# Patient Record
Sex: Female | Born: 1940 | Race: Black or African American | Hispanic: No | Smoking: Current some day smoker
Health system: Southern US, Community
[De-identification: ages and names within clinical notes are randomized; demographics above are authoritative.]

## PROBLEM LIST (undated history)

## (undated) DIAGNOSIS — E119 Type 2 diabetes mellitus without complications: Secondary | ICD-10-CM

## (undated) DIAGNOSIS — E7800 Pure hypercholesterolemia, unspecified: Secondary | ICD-10-CM

## (undated) DIAGNOSIS — R42 Dizziness and giddiness: Secondary | ICD-10-CM

## (undated) DIAGNOSIS — I10 Essential (primary) hypertension: Secondary | ICD-10-CM

## (undated) HISTORY — PX: ABDOMINAL HYSTERECTOMY: SHX81

---

## 2017-01-09 ENCOUNTER — Emergency Department (HOSPITAL_BASED_OUTPATIENT_CLINIC_OR_DEPARTMENT_OTHER)
Admission: EM | Admit: 2017-01-09 | Discharge: 2017-01-09 | Disposition: A | Discharge status: Home / Self Care | Payer: Medicare HMO | Attending: Physician Assistant | Admitting: Physician Assistant

## 2017-01-09 ENCOUNTER — Encounter (HOSPITAL_BASED_OUTPATIENT_CLINIC_OR_DEPARTMENT_OTHER): Payer: Self-pay | Admitting: *Deleted

## 2017-01-09 ENCOUNTER — Emergency Department (HOSPITAL_BASED_OUTPATIENT_CLINIC_OR_DEPARTMENT_OTHER): Payer: Medicare HMO

## 2017-01-09 DIAGNOSIS — F17200 Nicotine dependence, unspecified, uncomplicated: Secondary | ICD-10-CM | POA: Insufficient documentation

## 2017-01-09 DIAGNOSIS — I10 Essential (primary) hypertension: Secondary | ICD-10-CM | POA: Diagnosis not present

## 2017-01-09 DIAGNOSIS — H81399 Other peripheral vertigo, unspecified ear: Secondary | ICD-10-CM | POA: Insufficient documentation

## 2017-01-09 DIAGNOSIS — R42 Dizziness and giddiness: Secondary | ICD-10-CM | POA: Diagnosis present

## 2017-01-09 DIAGNOSIS — Z79899 Other long term (current) drug therapy: Secondary | ICD-10-CM | POA: Diagnosis not present

## 2017-01-09 DIAGNOSIS — E119 Type 2 diabetes mellitus without complications: Secondary | ICD-10-CM | POA: Diagnosis not present

## 2017-01-09 HISTORY — DX: Essential (primary) hypertension: I10

## 2017-01-09 HISTORY — DX: Dizziness and giddiness: R42

## 2017-01-09 HISTORY — DX: Pure hypercholesterolemia, unspecified: E78.00

## 2017-01-09 HISTORY — DX: Type 2 diabetes mellitus without complications: E11.9

## 2017-01-09 LAB — COMPREHENSIVE METABOLIC PANEL
ALK PHOS: 63 U/L (ref 38–126)
ALT: 18 U/L (ref 14–54)
ANION GAP: 8 (ref 5–15)
AST: 24 U/L (ref 15–41)
Albumin: 4.2 g/dL (ref 3.5–5.0)
BILIRUBIN TOTAL: 0.2 mg/dL — AB (ref 0.3–1.2)
BUN: 18 mg/dL (ref 6–20)
CO2: 30 mmol/L (ref 22–32)
Calcium: 9.3 mg/dL (ref 8.9–10.3)
Chloride: 102 mmol/L (ref 101–111)
Creatinine, Ser: 0.88 mg/dL (ref 0.44–1.00)
GFR calc non Af Amer: >60 mL/min (ref >60)
Glucose, Bld: 85 mg/dL (ref 65–99)
Potassium: 4.1 mmol/L (ref 3.5–5.1)
SODIUM: 140 mmol/L (ref 135–145)
TOTAL PROTEIN: 7 g/dL (ref 6.5–8.1)

## 2017-01-09 LAB — TROPONIN I: Troponin I: <0.03 ng/mL (ref <0.03)

## 2017-01-09 LAB — RAPID URINE DRUG SCREEN, HOSP PERFORMED
Amphetamines: NOT DETECTED
BENZODIAZEPINES: NOT DETECTED
Barbiturates: NOT DETECTED
COCAINE: NOT DETECTED
Opiates: NOT DETECTED
Tetrahydrocannabinol: NOT DETECTED

## 2017-01-09 LAB — DIFFERENTIAL
Basophils Absolute: 0 10*3/uL (ref 0.0–0.1)
Basophils Relative: 0 %
EOS PCT: 2 %
Eosinophils Absolute: 0.1 10*3/uL (ref 0.0–0.7)
LYMPHS ABS: 2.6 10*3/uL (ref 0.7–4.0)
LYMPHS PCT: 50 %
MONO ABS: 0.3 10*3/uL (ref 0.1–1.0)
Monocytes Relative: 6 %
Neutro Abs: 2.2 10*3/uL (ref 1.7–7.7)
Neutrophils Relative %: 42 %

## 2017-01-09 LAB — URINALYSIS, ROUTINE W REFLEX MICROSCOPIC
Bilirubin Urine: NEGATIVE
GLUCOSE, UA: NEGATIVE mg/dL
HGB URINE DIPSTICK: NEGATIVE
Ketones, ur: NEGATIVE mg/dL
Leukocytes, UA: NEGATIVE
Nitrite: NEGATIVE
Protein, ur: NEGATIVE mg/dL
SPECIFIC GRAVITY, URINE: 1.011 (ref 1.005–1.030)
pH: 6.5 (ref 5.0–8.0)

## 2017-01-09 LAB — CBC
HCT: 40.1 % (ref 36.0–46.0)
Hemoglobin: 13.7 g/dL (ref 12.0–15.0)
MCH: 30.9 pg (ref 26.0–34.0)
MCHC: 34.2 g/dL (ref 30.0–36.0)
MCV: 90.5 fL (ref 78.0–100.0)
PLATELETS: 255 10*3/uL (ref 150–400)
RBC: 4.43 MIL/uL (ref 3.87–5.11)
RDW: 13.7 % (ref 11.5–15.5)
WBC: 5.2 10*3/uL (ref 4.0–10.5)

## 2017-01-09 LAB — PROTIME-INR
INR: 0.92
PROTHROMBIN TIME: 12.4 s (ref 11.4–15.2)

## 2017-01-09 LAB — ETHANOL

## 2017-01-09 LAB — APTT: aPTT: 29 seconds (ref 24–36)

## 2017-01-09 MED ORDER — ONDANSETRON HCL 4 MG PO TABS: 4.0000 mg | ORAL_TABLET | Freq: Three times a day (TID) | ORAL | 0 refills | Status: DC | PRN

## 2017-01-09 MED ORDER — SODIUM CHLORIDE 0.9 % IV BOLUS (SEPSIS)
1000.0000 mL | Freq: Once | INTRAVENOUS | Status: AC
  Administered 2017-01-09: 1000 mL via INTRAVENOUS

## 2017-01-09 MED ORDER — MECLIZINE HCL 25 MG PO TABS: 25.0000 mg | ORAL_TABLET | Freq: Three times a day (TID) | ORAL | 0 refills | Status: DC | PRN

## 2017-01-09 MED ORDER — MECLIZINE HCL 25 MG PO TABS
50.0000 mg | ORAL_TABLET | Freq: Once | ORAL | Status: AC
  Administered 2017-01-09: 50 mg via ORAL
  Filled 2017-01-09: qty 2

## 2017-01-09 NOTE — ED Triage Notes (Signed)
Dizziness. Hx of vertigo 2 weeks ago.

## 2017-01-09 NOTE — ED Provider Notes (Signed)
[facility]   CSN: [id number] Arrival date & time: 01/09/17  1528  By signing my name below, I, Alison Garcia, attest that this documentation has been prepared under the direction and in the presence of Arthor CaptainAbigail Arelly Whittenberg, PA-C.  Electronically Signed: Rosana Fretana Garcia, ED Scribe. 01/09/17. 4:48 PM.  History   Chief Complaint Chief Complaint  Patient presents with  . Dizziness   The history is provided by the patient. No language interpreter was used.   HPI Comments: Alison Garcia is a 76 y.o. female with a PMHx of DM and HLD, who presents to the Emergency Department complaining of sudden onset, moderate dizziness onset yesterday. Pt describes the dizziness as a room-spinning sensation. Per pt, she had an episode of vertigo with associated vomiting 7 months ago and 1 month ago. Since the most recent episode, pt notes she has a dull headache in her forehead and dizziness when she moves her head downward. Pt uses tobacco. No hx of HTN. Pt tried meclizine after her episode 1 month ago with no relief.   Past Medical History:  Diagnosis Date  . Diabetes mellitus without complication (HCC)   . High cholesterol   . Hypertension   . Vertigo     There are no active problems to display for this patient.   History reviewed. No pertinent surgical history.  OB History    No data available       Home Medications    Prior to Admission medications   Medication Sig Start Date End Date Taking? Authorizing Provider  LISINOPRIL PO Take by mouth.   Yes [provider]  LOVASTATIN PO Take by mouth.   Yes [provider]  METFORMIN HCL PO Take by mouth.   Yes [provider]    Family History No family history on file.  Social History Social History  Substance Use Topics  . Smoking status: Current Some Day Smoker  . Smokeless tobacco: Never Used  . Alcohol use No     Allergies   Codeine   Review of Systems Review of  Systems All other systems reviewed and are negative for acute change except as noted in the HPI.  Physical Exam Updated Vital Signs BP (!) 111/57 (BP Location: Left Arm)   Pulse 72   Temp 98 F (36.7 C) (Oral)   Resp 16   Ht 5\' 6"  (1.676 m)   Wt 120 lb (54.4 kg)   SpO2 96% Comment: unable to obtain. finger cold  BMI 19.37 kg/m   Physical Exam  Constitutional: She is oriented to person, place, and time. She appears well-developed and well-nourished. No distress.  HENT:  Head: Normocephalic and atraumatic.  Mouth/Throat: Oropharynx is clear and moist.  Eyes: Pupils are equal, round, and reactive to light. Conjunctivae and EOM are normal. Right eye exhibits no discharge. Left eye exhibits no discharge.  Neck: Normal range of motion.  Cardiovascular: Normal rate, regular rhythm and normal heart sounds.  Exam reveals no gallop and no friction rub.   No murmur heard. Pulmonary/Chest: Effort normal and breath sounds normal. No respiratory distress. She has no wheezes. She has no rales.  Abdominal: Soft. She exhibits no distension. There is no tenderness.  Musculoskeletal: Normal range of motion.  Neurological: She is alert and oriented to person, place, and time. She displays normal reflexes. No cranial nerve deficit or sensory deficit. She exhibits normal muscle tone. Coordination normal.  Speech is clear and goal oriented, follows commands Major Cranial nerves without  deficit, no facial droop Normal strength in upper and lower extremities bilaterally including dorsiflexion and plantar flexion, strong and equal grip strength Sensation normal to light and sharp touch Moves extremities without ataxia, coordination intact Normal finger to nose and rapid alternating movements Neg romberg, no pronator drift Normal gait Normal heel-shin and balance   Skin: Skin is warm and dry.  Psychiatric: She has a normal mood and affect. Judgment normal.  Nursing note and vitals reviewed.    ED  Treatments / Results  DIAGNOSTIC STUDIES: Oxygen Saturation is 100% on RA, normal by my interpretation.   COORDINATION OF CARE: 4:46 PM-Discussed next steps with pt including a CT. Pt verbalized understanding and is agreeable with the plan.   Labs (all labs ordered are listed, but only abnormal results are displayed) Labs Reviewed  COMPREHENSIVE METABOLIC PANEL - Abnormal; Notable for the following:       Result Value   Total Bilirubin 0.2 (*)    All other components within normal limits  ETHANOL  PROTIME-INR  APTT  CBC  DIFFERENTIAL  RAPID URINE DRUG SCREEN, HOSP PERFORMED  URINALYSIS, ROUTINE W REFLEX MICROSCOPIC  TROPONIN I    EKG  EKG Interpretation None       Radiology Ct Head Wo Contrast  Result Date: 01/09/2017 CLINICAL DATA:  Vertigo today.  Headache. EXAM: CT HEAD WITHOUT CONTRAST TECHNIQUE: Contiguous axial images were obtained from the base of the skull through the vertex without intravenous contrast. COMPARISON:  None. FINDINGS: Brain: There is mild cortical atrophy. No evidence of acute abnormality including hemorrhage, infarct, mass lesion, mass effect, midline shift or abnormal extra-axial fluid collection. No hydrocephalus or pneumocephalus. Vascular: Atherosclerosis noted. Skull: Intact. Sinuses/Orbits: Negative. Other: None. IMPRESSION: No acute abnormality. Mild atrophy. Atherosclerosis. Electronically Signed   By: Drusilla Kanner M.D.   On: 01/09/2017 17:37    Procedures Procedures (including critical care time)  Medications Ordered in ED Medications  meclizine (ANTIVERT) tablet 50 mg (50 mg Oral Given 01/09/17 1657)  sodium chloride 0.9 % bolus 1,000 mL (1,000 mLs Intravenous New Bag/Given 01/09/17 1710)     Initial Impression / Assessment and Plan / ED Course  I have reviewed the triage vital signs and the nursing notes.  Pertinent labs & imaging results that were available during my care of the patient were reviewed by me and considered in my  medical decision making (see chart for details).     . The patient with positional vertigo symptoms. She is asymptomatic after meclizine. 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 The patient is without nausea or vomiting at this time. Negative CT head. Although she has risk factors for central cause. I doubt stroke and I have seen the patient in shared visit with Dr. Corlis Leak who is in strong agreement. The patient will be discharged with meclizine and Zofran. I discussed return precautions. She is to follow up with PCP. Also given  Referral to ear, nose and throat.  Final Clinical Impressions(s) / ED Diagnoses   Final diagnoses:  Peripheral vertigo, unspecified laterality    New Prescriptions New Prescriptions   No medications on file  I personally performed the services described in this documentation, which was scribed in my presence. The recorded information has been reviewed and is accurate.       Arthor Captain, PA-C 01/10/17 0051    Abelino Derrick, MD 01/10/17 1559    Abelino Derrick, MD 01/10/17 1600

## 2017-01-09 NOTE — Discharge Instructions (Signed)
Get help right away if: You have difficulty speaking or moving. You are always dizzy. You faint. You develop severe headaches. You have weakness in your legs or arms. You have changes in your hearing or vision. You develop a stiff neck. You develop sensitivity to light.

## 2017-01-09 NOTE — ED Notes (Signed)
Pt on monitor 

## 2019-08-28 ENCOUNTER — Emergency Department (HOSPITAL_BASED_OUTPATIENT_CLINIC_OR_DEPARTMENT_OTHER)
Admission: EM | Admit: 2019-08-28 | Discharge: 2019-08-28 | Disposition: A | Discharge status: Home / Self Care | Payer: Medicare HMO | Attending: Emergency Medicine | Admitting: Emergency Medicine

## 2019-08-28 ENCOUNTER — Encounter (HOSPITAL_BASED_OUTPATIENT_CLINIC_OR_DEPARTMENT_OTHER): Payer: Self-pay

## 2019-08-28 ENCOUNTER — Other Ambulatory Visit: Payer: Self-pay

## 2019-08-28 ENCOUNTER — Emergency Department (HOSPITAL_BASED_OUTPATIENT_CLINIC_OR_DEPARTMENT_OTHER): Payer: Medicare HMO

## 2019-08-28 DIAGNOSIS — J189 Pneumonia, unspecified organism: Secondary | ICD-10-CM | POA: Diagnosis not present

## 2019-08-28 DIAGNOSIS — Z885 Allergy status to narcotic agent status: Secondary | ICD-10-CM | POA: Insufficient documentation

## 2019-08-28 DIAGNOSIS — E119 Type 2 diabetes mellitus without complications: Secondary | ICD-10-CM | POA: Diagnosis not present

## 2019-08-28 DIAGNOSIS — R0789 Other chest pain: Secondary | ICD-10-CM | POA: Insufficient documentation

## 2019-08-28 DIAGNOSIS — R059 Cough, unspecified: Secondary | ICD-10-CM

## 2019-08-28 DIAGNOSIS — E782 Mixed hyperlipidemia: Secondary | ICD-10-CM | POA: Diagnosis not present

## 2019-08-28 DIAGNOSIS — Z7984 Long term (current) use of oral hypoglycemic drugs: Secondary | ICD-10-CM | POA: Diagnosis not present

## 2019-08-28 DIAGNOSIS — F17210 Nicotine dependence, cigarettes, uncomplicated: Secondary | ICD-10-CM | POA: Insufficient documentation

## 2019-08-28 DIAGNOSIS — I10 Essential (primary) hypertension: Secondary | ICD-10-CM | POA: Insufficient documentation

## 2019-08-28 DIAGNOSIS — J029 Acute pharyngitis, unspecified: Secondary | ICD-10-CM | POA: Diagnosis present

## 2019-08-28 DIAGNOSIS — Z79899 Other long term (current) drug therapy: Secondary | ICD-10-CM | POA: Diagnosis not present

## 2019-08-28 DIAGNOSIS — R05 Cough: Secondary | ICD-10-CM

## 2019-08-28 LAB — BASIC METABOLIC PANEL
Anion gap: 7 (ref 5–15)
BUN: 14 mg/dL (ref 8–23)
CO2: 28 mmol/L (ref 22–32)
Calcium: 9.3 mg/dL (ref 8.9–10.3)
Chloride: 102 mmol/L (ref 98–111)
Creatinine, Ser: 0.76 mg/dL (ref 0.44–1.00)
GFR calc Af Amer: >60 mL/min (ref >60)
GFR calc non Af Amer: >60 mL/min (ref >60)
Glucose, Bld: 100 mg/dL — ABNORMAL HIGH (ref 70–99)
Potassium: 4.5 mmol/L (ref 3.5–5.1)
Sodium: 137 mmol/L (ref 135–145)

## 2019-08-28 LAB — CBC
HCT: 42.8 % (ref 36.0–46.0)
Hemoglobin: 13.8 g/dL (ref 12.0–15.0)
MCH: 30.5 pg (ref 26.0–34.0)
MCHC: 32.2 g/dL (ref 30.0–36.0)
MCV: 94.7 fL (ref 80.0–100.0)
Platelets: 231 10*3/uL (ref 150–400)
RBC: 4.52 MIL/uL (ref 3.87–5.11)
RDW: 13.5 % (ref 11.5–15.5)
WBC: 4.6 10*3/uL (ref 4.0–10.5)
nRBC: 0 % (ref 0.0–0.2)

## 2019-08-28 LAB — TROPONIN I (HIGH SENSITIVITY): Troponin I (High Sensitivity): 3 ng/L (ref <18)

## 2019-08-28 MED ORDER — AEROCHAMBER PLUS FLO-VU MISC: 2 refills | Status: AC

## 2019-08-28 MED ORDER — ALBUTEROL SULFATE HFA 108 (90 BASE) MCG/ACT IN AERS: 2.0000 | INHALATION_SPRAY | RESPIRATORY_TRACT | 0 refills | Status: DC | PRN

## 2019-08-28 MED ORDER — PANTOPRAZOLE SODIUM 20 MG PO TBEC: 20.0000 mg | DELAYED_RELEASE_TABLET | Freq: Every day | ORAL | 0 refills | Status: DC

## 2019-08-28 MED ORDER — AZITHROMYCIN 250 MG PO TABS: ORAL_TABLET | ORAL | 0 refills | Status: DC

## 2019-08-28 MED ORDER — HYDROCODONE-HOMATROPINE 5-1.5 MG/5ML PO SYRP: 5.0000 mL | ORAL_SOLUTION | Freq: Four times a day (QID) | ORAL | 0 refills | Status: DC | PRN

## 2019-08-28 NOTE — ED Provider Notes (Addendum)
[facility] EMERGENCY DEPARTMENT Provider Note   CSN: [id number] Arrival date & time: 08/28/19  1139     History Chief Complaint  Patient presents with  . Sore Throat  . Chest Pain    Alison Garcia is a 79 y.o. female.  HPI Patient reports that she has had some sore throat started about 2 days ago.  She reports she noticed it most on the left side as she was swallowing or taking deep breath.  Patient is also developed cough over the past 2 days.  She reports it started to become very productive of mucus.  She reports she feels like something is kind of congested and the center of her chest and also notes that when she is eating it feels like things get stuck a little bit.  No vomiting.  No abdominal pain.  No diarrhea.  She reports she has been experiencing some chest discomfort with coughing.  With coughing episodes she reports feeling slightly short of breath.  Patient reports that she got her second Covid shot 2 days ago.  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  Reports her diabetes is now diet controlled.  She does not require any medications.    Past Medical History:  Diagnosis Date  . Diabetes mellitus without complication (HCC)   . High cholesterol   . Hypertension   . Vertigo     There are no problems to display for this patient.   History reviewed. No pertinent surgical history.   OB History   No obstetric history on file.     No family history on file.  Social History   Tobacco Use  . Smoking status: Current Some Day Smoker  . Smokeless tobacco: Never Used  Substance Use Topics  . Alcohol use: No  . Drug use: No    Home Medications Prior to Admission medications   Medication Sig Start Date End Date Taking? Authorizing Provider  albuterol (VENTOLIN HFA) 108 (90 Base) MCG/ACT inhaler Inhale 2 puffs into the lungs every 4 (four) hours as  needed for wheezing or shortness of breath. 08/28/19   Arby Barrette, MD  azithromycin (ZITHROMAX Z-PAK) 250 MG tablet 2 po day one, then 1 daily x 4 days 08/28/19   Arby Barrette, MD  HYDROcodone-homatropine [facility]) 5-1.5 MG/5ML syrup Take 5 mLs by mouth every 6 (six) hours as needed for cough. 08/28/19   Arby Barrette, MD  LISINOPRIL PO Take by mouth.    [provider]  LOVASTATIN PO Take by mouth.    [provider]  meclizine (ANTIVERT) 25 MG tablet Take 1-2 tablets (25-50 mg total) by mouth 3 (three) times daily as needed for dizziness or nausea. 01/09/17   Harris, Abigail, PA-C  METFORMIN HCL PO Take by mouth.    [provider]  ondansetron (ZOFRAN) 4 MG tablet Take 1 tablet (4 mg total) by mouth every 8 (eight) hours as needed for nausea or vomiting. 01/09/17   Arthor Captain, PA-C  pantoprazole (PROTONIX) 20 MG tablet Take 1 tablet (20 mg total) by mouth daily. 08/28/19   Arby Barrette, MD  Spacer/Aero-Holding Chambers (AEROCHAMBER PLUS WITH MASK) inhaler Use as instructed 08/28/19   Arby Barrette, MD    Allergies    Codeine  Review of Systems   Review of Systems 10 Systems reviewed and are negative for acute change except as noted in the  HPI. Physical Exam Updated Vital Signs BP 107/61 (BP Location: Right Arm)   Pulse 81   Temp 98.9 F (37.2 C) (Oral)   Resp 18   Ht 5\' 6"  (1.676 m)   Wt 52.6 kg   SpO2 97%   BMI 18.72 kg/m   Physical Exam Constitutional:      Comments: Patient is alert and clinically well in appearance.  Well-nourished well-developed.  No respiratory distress.  HENT:     Head: Normocephalic and atraumatic.     Nose: Nose normal.     Mouth/Throat:     Mouth: Mucous membranes are moist.     Pharynx: Oropharynx is clear.     Comments: No erythema or exudate of the posterior oropharynx. Eyes:     Extraocular Movements: Extraocular movements intact.     Conjunctiva/sclera: Conjunctivae normal.  Neck:     Comments:  Neck is supple.  Patient has mild discomfort to the cervical tonsil area on the left.  There however is no significant lymphadenopathy.  No soft tissue swelling. Cardiovascular:     Rate and Rhythm: Normal rate and regular rhythm.     Pulses: Normal pulses.     Heart sounds: Normal heart sounds.  Pulmonary:     Comments: No respiratory distress.  Lungs are clear on the left.  Right lung has occasional expiratory wheeze from the mid lung field to the base. Abdominal:     General: There is no distension.     Palpations: Abdomen is soft.     Tenderness: There is no abdominal tenderness. There is no guarding.  Musculoskeletal:        General: No swelling or tenderness. Normal range of motion.     Right lower leg: No edema.     Left lower leg: No edema.  Skin:    General: Skin is warm and dry.  Neurological:     General: No focal deficit present.     Mental Status: She is oriented to person, place, and time.     Coordination: Coordination normal.  Psychiatric:        Mood and Affect: Mood normal.     ED Results / Procedures / Treatments   Labs (all labs ordered are listed, but only abnormal results are displayed) Labs Reviewed  BASIC METABOLIC PANEL - Abnormal; Notable for the following components:      Result Value   Glucose, Bld 100 (*)    All other components within normal limits  CBC  TROPONIN I (HIGH SENSITIVITY)  TROPONIN I (HIGH SENSITIVITY)    EKG EKG Interpretation  Date/Time:  Sunday August 28 2019 12:36:24 EST Ventricular Rate:  78 PR Interval:  156 QRS Duration: 64 QT Interval:  352 QTC Calculation: 401 R Axis:   83 Text Interpretation: Normal sinus rhythm Normal ECG no ischemic changes, no change from previous Confirmed by [id number] [phone]) on 08/28/2019 12:43:29 PM   Radiology DG Chest Port 1 View  Result Date: 08/28/2019 CLINICAL DATA:  Is sore throat, left-sided chest pain with mild shortness of breath and cough for 3 days. EXAM: PORTABLE CHEST  1 VIEW COMPARISON:  03/16/2019 FINDINGS: Interval development of predominantly linear opacity in the right lung base compared to the prior study. Cardiomediastinal contours are stable with signs of aortic atherosclerosis. Lungs are otherwise clear. No signs of pleural effusion. Visualized skeletal structures are unremarkable. IMPRESSION: Interval development of predominantly linear opacity in the right lung base which may represent atelectasis or early infiltrate. Electronically Signed  By: Zetta Bills M.D.   On: 08/28/2019 13:08    Procedures Procedures (including critical care time)  Medications Ordered in ED Medications - No data to display  ED Course  I have reviewed the triage vital signs and the nursing notes.  Pertinent labs & imaging results that were available during my care of the patient were reviewed by me and considered in my medical decision making (see chart for details).    MDM Rules/Calculators/A&P                     Patient is clinically well in appearance.  She has developed sore throat and productive cough over the past 2 days.  No documented fever.  Patient denies she is having generalized body aches or chills.  She did just get her second Covid shot 2 days ago.  At this time, I have lower suspicion for Covid.  Patient does have positive infiltrate identified on chest x-ray.  Along with this she does have audible wheeze local to this area combined with productive cough.  Patient however is nontoxic.  Her mental status is excellent.  She is in very good physical condition with well-controlled chronic medical problems.  No lower extremity swelling or calf tenderness.  At this time will initiate treatment for community-acquired pneumonia with azithromycin.  Patient counseled to use inhaler for wheezing and Hycodan if needed for nighttime cough.  Patient also described sensation of congestion in the center of her chest and food seeming to stick a little bit.  Will try an  empiric course of Protonix for 2 weeks.  She is to discuss this with her PCP.  No signs of impaction or food bolus.  Troponin normal and EKG without ischemic appearance.  Very low suspicion for cardiac ischemic etiology.  Return precautions are reviewed.  Patient should have recheck with PCP for symptom recheck in the next week. Final Clinical Impression(s) / ED Diagnoses Final diagnoses:  Community acquired pneumonia of right lower lobe of lung    Rx / DC Orders ED Discharge Orders         Ordered    azithromycin (ZITHROMAX Z-PAK) 250 MG tablet     08/28/19 1510    Spacer/Aero-Holding Chambers (AEROCHAMBER PLUS WITH MASK) inhaler     08/28/19 1510    albuterol (VENTOLIN HFA) 108 (90 Base) MCG/ACT inhaler  Every 4 hours PRN     08/28/19 1510    HYDROcodone-homatropine (HYCODAN) 5-1.5 MG/5ML syrup  Every 6 hours PRN     08/28/19 1510    pantoprazole (PROTONIX) 20 MG tablet  Daily     08/28/19 1515           Charlesetta Shanks, MD 08/28/19 1517    Charlesetta Shanks, MD 08/28/19 1517

## 2019-08-28 NOTE — Discharge Instructions (Addendum)
1.  Start taking azithromycin as prescribed.  You take 2 tablets on the first day and then 1 tablet daily for 4 days. 2.  Use the inhaler as prescribed with the spacer.  This is to treat wheezing that is heard in your right lung.  This should also help with coughing. 3.  You may try the Hycodan syrup for nighttime cough.  This may make you drowsy.  Do not take this when you will be out and driving. 4.  Make an appointment to see your family doctor for recheck within the next week. 5.  Return to the emergency department if you develop a fever, worsening chest pain, shortness of breath or other concerning symptoms.

## 2019-08-28 NOTE — ED Triage Notes (Signed)
Pt arrives ambulatory to ED with reports of sore throat, states it radiates into her chest, pt points to left chest. Also endorses chills last night with cough and SOB. States that she his history of bronchitis.

## 2020-10-23 ENCOUNTER — Other Ambulatory Visit: Payer: Self-pay

## 2020-10-23 ENCOUNTER — Emergency Department (HOSPITAL_BASED_OUTPATIENT_CLINIC_OR_DEPARTMENT_OTHER): Payer: Medicare HMO

## 2020-10-23 ENCOUNTER — Emergency Department (HOSPITAL_BASED_OUTPATIENT_CLINIC_OR_DEPARTMENT_OTHER)
Admission: EM | Admit: 2020-10-23 | Discharge: 2020-10-23 | Disposition: A | Discharge status: Home / Self Care | Payer: Medicare HMO | Attending: Emergency Medicine | Admitting: Emergency Medicine

## 2020-10-23 ENCOUNTER — Encounter (HOSPITAL_BASED_OUTPATIENT_CLINIC_OR_DEPARTMENT_OTHER): Payer: Self-pay

## 2020-10-23 DIAGNOSIS — E119 Type 2 diabetes mellitus without complications: Secondary | ICD-10-CM | POA: Diagnosis not present

## 2020-10-23 DIAGNOSIS — S060X0A Concussion without loss of consciousness, initial encounter: Secondary | ICD-10-CM | POA: Diagnosis not present

## 2020-10-23 DIAGNOSIS — R1110 Vomiting, unspecified: Secondary | ICD-10-CM

## 2020-10-23 DIAGNOSIS — Z79899 Other long term (current) drug therapy: Secondary | ICD-10-CM | POA: Insufficient documentation

## 2020-10-23 DIAGNOSIS — F17200 Nicotine dependence, unspecified, uncomplicated: Secondary | ICD-10-CM | POA: Insufficient documentation

## 2020-10-23 DIAGNOSIS — W19XXXA Unspecified fall, initial encounter: Secondary | ICD-10-CM

## 2020-10-23 DIAGNOSIS — R197 Diarrhea, unspecified: Secondary | ICD-10-CM | POA: Insufficient documentation

## 2020-10-23 DIAGNOSIS — Z7984 Long term (current) use of oral hypoglycemic drugs: Secondary | ICD-10-CM | POA: Insufficient documentation

## 2020-10-23 DIAGNOSIS — W01198A Fall on same level from slipping, tripping and stumbling with subsequent striking against other object, initial encounter: Secondary | ICD-10-CM | POA: Insufficient documentation

## 2020-10-23 DIAGNOSIS — I10 Essential (primary) hypertension: Secondary | ICD-10-CM | POA: Insufficient documentation

## 2020-10-23 DIAGNOSIS — S0990XA Unspecified injury of head, initial encounter: Secondary | ICD-10-CM | POA: Diagnosis present

## 2020-10-23 LAB — CBC WITH DIFFERENTIAL/PLATELET
Abs Immature Granulocytes: 0.01 10*3/uL (ref 0.00–0.07)
Basophils Absolute: 0 10*3/uL (ref 0.0–0.1)
Basophils Relative: 0 %
Eosinophils Absolute: 0 10*3/uL (ref 0.0–0.5)
Eosinophils Relative: 1 %
HCT: 40.6 % (ref 36.0–46.0)
Hemoglobin: 13.3 g/dL (ref 12.0–15.0)
Immature Granulocytes: 0 %
Lymphocytes Relative: 44 %
Lymphs Abs: 1.8 10*3/uL (ref 0.7–4.0)
MCH: 31.1 pg (ref 26.0–34.0)
MCHC: 32.8 g/dL (ref 30.0–36.0)
MCV: 94.9 fL (ref 80.0–100.0)
Monocytes Absolute: 0.3 10*3/uL (ref 0.1–1.0)
Monocytes Relative: 7 %
Neutro Abs: 1.9 10*3/uL (ref 1.7–7.7)
Neutrophils Relative %: 48 %
Platelets: 218 10*3/uL (ref 150–400)
RBC: 4.28 MIL/uL (ref 3.87–5.11)
RDW: 13.2 % (ref 11.5–15.5)
WBC: 4.1 10*3/uL (ref 4.0–10.5)
nRBC: 0 % (ref 0.0–0.2)

## 2020-10-23 LAB — COMPREHENSIVE METABOLIC PANEL
ALT: 28 U/L (ref 0–44)
AST: 38 U/L (ref 15–41)
Albumin: 3.7 g/dL (ref 3.5–5.0)
Alkaline Phosphatase: 58 U/L (ref 38–126)
Anion gap: 9 (ref 5–15)
BUN: 13 mg/dL (ref 8–23)
CO2: 24 mmol/L (ref 22–32)
Calcium: 9 mg/dL (ref 8.9–10.3)
Chloride: 104 mmol/L (ref 98–111)
Creatinine, Ser: 0.75 mg/dL (ref 0.44–1.00)
GFR, Estimated: >60 mL/min (ref >60)
Glucose, Bld: 96 mg/dL (ref 70–99)
Potassium: 4.2 mmol/L (ref 3.5–5.1)
Sodium: 137 mmol/L (ref 135–145)
Total Bilirubin: 0.3 mg/dL (ref 0.3–1.2)
Total Protein: 6.5 g/dL (ref 6.5–8.1)

## 2020-10-23 LAB — URINALYSIS, ROUTINE W REFLEX MICROSCOPIC
Bilirubin Urine: NEGATIVE
Glucose, UA: NEGATIVE mg/dL
Hgb urine dipstick: NEGATIVE
Ketones, ur: NEGATIVE mg/dL
Leukocytes,Ua: NEGATIVE
Nitrite: NEGATIVE
Protein, ur: NEGATIVE mg/dL
Specific Gravity, Urine: <1.005 — ABNORMAL LOW (ref 1.005–1.030)
pH: 5.5 (ref 5.0–8.0)

## 2020-10-23 LAB — CBG MONITORING, ED: Glucose-Capillary: 90 mg/dL (ref 70–99)

## 2020-10-23 MED ORDER — SODIUM CHLORIDE 0.9 % IV BOLUS
500.0000 mL | Freq: Once | INTRAVENOUS | Status: AC
Start: 2020-10-23 — End: 2020-10-23
  Administered 2020-10-23: 500 mL via INTRAVENOUS

## 2020-10-23 MED ORDER — MECLIZINE HCL 12.5 MG PO TABS: 12.5000 mg | ORAL_TABLET | Freq: Three times a day (TID) | ORAL | 0 refills | Status: AC | PRN

## 2020-10-23 MED ORDER — ONDANSETRON 4 MG PO TBDP: 4.0000 mg | ORAL_TABLET | Freq: Three times a day (TID) | ORAL | 0 refills | Status: AC | PRN

## 2020-10-23 NOTE — ED Provider Notes (Signed)
[facility] EMERGENCY DEPARTMENT Provider Note   CSN: [id number] Arrival date & time: 10/23/20  3299     History Chief Complaint  Patient presents with  . Fall    Alison Garcia is a 80 y.o. female.  80 year old female with past medical history of diabetes, hyperlipidemia, hypertension and vertigo presents with complaint of vomiting, diarrhea, feeling unwell.  Patient states that on Saturday (October 20, 2020) patient was raking leaves when she slipped and fell backwards hitting the posterior left portion of her head into her [facility] unit.  Denies loss of consciousness, was able to stand upright after the fall and continued working.  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  Patient developed loose stools on Sunday, took Pepto-Bismol and states her stools have been black as expected from taking Pepto-Bismol.  Patient felt better on Monday and then woke up today feeling unwell again which prompted her to come to the emergency room.  Dizziness is described as room spinning, similar to her typical vertigo.  States that she had chills throughout the weekend, was not febrile.  Denies abdominal pain, chest pain, difficulty breathing.  Patient is not anticoagulated, no history of prior GI bleed.  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        Past Medical History:  Diagnosis Date  . Diabetes mellitus without complication (HCC)   . High cholesterol   . Hypertension    pt denies  . Vertigo     There are no problems to display for this patient.   Past Surgical History:  Procedure Laterality Date  . ABDOMINAL HYSTERECTOMY       OB History   No obstetric history on file.     History reviewed. No pertinent family history.  Social  History   Tobacco Use  . Smoking status: Current Some Day Smoker  . Smokeless tobacco: Never Used  Substance Use Topics  . Alcohol use: No  . Drug use: No    Home Medications Prior to Admission medications   Medication Sig Start Date End Date Taking? Authorizing Provider  LOVASTATIN PO Take by mouth.   Yes [provider]  meclizine (ANTIVERT) 12.5 MG tablet Take 1 tablet (12.5 mg total) by mouth 3 (three) times daily as needed for dizziness. 10/23/20  Yes Jeannie Fend, PA-C  ondansetron (ZOFRAN ODT) 4 MG disintegrating tablet Take 1 tablet (4 mg total) by mouth every 8 (eight) hours as needed for nausea or vomiting. 10/23/20  Yes Jeannie Fend, PA-C  METFORMIN HCL PO Take by mouth.    [provider]  Spacer/Aero-Holding Chambers (AEROCHAMBER PLUS WITH MASK) inhaler Use as instructed 08/28/19   Arby Barrette, MD  albuterol (VENTOLIN HFA) 108 (90 Base) MCG/ACT inhaler Inhale 2 puffs into the lungs every 4 (four) hours as needed for wheezing or shortness of breath. 08/28/19 10/23/20  Arby Barrette, MD  LISINOPRIL PO Take by mouth.  10/23/20  [provider]  pantoprazole (PROTONIX) 20 MG tablet Take 1 tablet (20 mg total) by mouth daily. 08/28/19 10/23/20  Arby Barrette, MD    Allergies    Codeine and Hydrocodone-homatropine  Review of Systems   Review of Systems  Constitutional: Positive for chills. Negative for fever.  Eyes: Negative for visual  disturbance.  Respiratory: Negative for shortness of breath.   Cardiovascular: Negative for chest pain.  Gastrointestinal: Positive for diarrhea, nausea and vomiting. Negative for abdominal pain, blood in stool and constipation.  Genitourinary: Negative for difficulty urinating and dysuria.  Musculoskeletal: Negative for back pain, gait problem, neck pain and neck stiffness.  Skin: Negative for rash and wound.  Allergic/Immunologic: Positive for immunocompromised state.  Neurological: Positive for dizziness,  weakness and headaches. Negative for speech difficulty.  Hematological: Does not bruise/bleed easily.  Psychiatric/Behavioral: Negative for confusion.  All other systems reviewed and are negative.   Physical Exam Updated Vital Signs BP (!) 112/56   Pulse 66   Temp 98.9 F (37.2 C) (Oral)   Resp 17   Ht 5\' 6"  (1.676 m)   Wt 54.4 kg   SpO2 100%   BMI 19.37 kg/m   Physical Exam Vitals and nursing note reviewed.  Constitutional:      General: She is not in acute distress.    Appearance: She is well-developed. She is not diaphoretic.  HENT:     Head: Normocephalic and atraumatic.     Nose: Nose normal.     Mouth/Throat:     Mouth: Mucous membranes are moist.  Eyes:     Extraocular Movements: Extraocular movements intact.     Conjunctiva/sclera: Conjunctivae normal.     Pupils: Pupils are equal, round, and reactive to light.  Cardiovascular:     Rate and Rhythm: Normal rate and regular rhythm.     Pulses: Normal pulses.     Heart sounds: Normal heart sounds.  Pulmonary:     Effort: Pulmonary effort is normal.     Breath sounds: Normal breath sounds.  Abdominal:     Palpations: Abdomen is soft.     Tenderness: There is no abdominal tenderness.  Musculoskeletal:        General: No swelling, tenderness, deformity or signs of injury. Normal range of motion.     Cervical back: Normal range of motion and neck supple. No tenderness or bony tenderness.     Thoracic back: No tenderness or bony tenderness.     Lumbar back: No tenderness or bony tenderness.     Right lower leg: No edema.     Left lower leg: No edema.  Skin:    General: Skin is warm and dry.     Coloration: Skin is not pale.     Findings: No erythema or rash.  Neurological:     Mental Status: She is alert and oriented to person, place, and time.     Cranial Nerves: No cranial nerve deficit.     Sensory: No sensory deficit.     Motor: No weakness.  Psychiatric:        Behavior: Behavior normal.     ED  Results / Procedures / Treatments   Labs (all labs ordered are listed, but only abnormal results are displayed) Labs Reviewed  URINALYSIS, ROUTINE W REFLEX MICROSCOPIC - Abnormal; Notable for the following components:      Result Value   Specific Gravity, Urine <1.005 (*)    All other components within normal limits  COMPREHENSIVE METABOLIC PANEL  CBC WITH DIFFERENTIAL/PLATELET  CBG MONITORING, ED    EKG EKG Interpretation  Date/Time:  Tuesday October 23 2020 10:40:06 EDT Ventricular Rate:  72 PR Interval:  156 QRS Duration: 75 QT Interval:  369 QTC Calculation: 404 R Axis:   70 Text Interpretation: Sinus rhythm Multiple ventricular premature complexes Confirmed by [id number] (  [id number]) on 10/23/2020 10:48:06 AM   Radiology CT Head Wo Contrast  Result Date: 10/23/2020 CLINICAL DATA:  Patient status post fall with a blow to the head 10/20/2020. Initial encounter. EXAM: CT HEAD WITHOUT CONTRAST TECHNIQUE: Contiguous axial images were obtained from the base of the skull through the vertex without intravenous contrast. COMPARISON:  Head CT scan 01/09/2017. FINDINGS: Brain: No evidence of acute infarction, hemorrhage, hydrocephalus, extra-axial collection or mass lesion/mass effect. Very mild cortical atrophy is unchanged. Vascular: No hyperdense vessel or unexpected calcification. Skull: Intact.  No focal lesion. Sinuses/Orbits: Negative. Other: None. IMPRESSION: Negative head CT. Electronically Signed   By: Drusilla Kanner M.D.   On: 10/23/2020 11:13   DG Chest Port 1 View  Result Date: 10/23/2020 CLINICAL DATA:  Weakness. EXAM: PORTABLE CHEST 1 VIEW COMPARISON:  CT 03/19/2020.  Chest X 08/28/2019. FINDINGS: Mediastinum and hilar structures normal. Heart size stable. No focal infiltrate. Interim resolution of previously identified right base atelectasis. No pleural effusion or pneumothorax. Degenerative change thoracic spine. IMPRESSION: No acute cardiopulmonary disease. Electronically  Signed   By: Maisie Fus  Register   On: 10/23/2020 11:13    Procedures Procedures   Medications Ordered in ED Medications  sodium chloride 0.9 % bolus 500 mL ( Intravenous Stopped 10/23/20 1204)    ED Course  I have reviewed the triage vital signs and the nursing notes.  Pertinent labs & imaging results that were available during my care of the patient were reviewed by me and considered in my medical decision making (see chart for details).  Clinical Course as of 10/23/20 1219  Tue Oct 23, 2020  [id number] 80 year old female with complaint of head injury with dizziness, vomiting, diarrhea as above. Well appearing on exam, no specific findings, gait normal/steady.  CT head normal. CXR normal. EKG with occasional PVCs. Labs reassuring including CBC, CMP, UA. Initial order for hemoccult for dark stools after taking pepto has been canceled, hgb is stable, vitals stable, doubt IG bleed. Discussed with Dr. Jacqulyn Bath, ER attending. Plan is to treat as concussion, follow up with patient's neurologist. Discussed results and plan of care with patient. Given zofran for her nasuea/vomiting. Given meclizine for her vertigo and discussed taking while someone is home to assist with ambulation incase this causes gait disturbance. Return precautions given.  [LM]    Clinical Course User Index [LM] Alden Hipp   MDM Rules/Calculators/A&P                          Final Clinical Impression(s) / ED Diagnoses Final diagnoses:  Fall, initial encounter  Concussion without loss of consciousness, initial encounter  Vomiting and diarrhea    Rx / DC Orders ED Discharge Orders         Ordered    meclizine (ANTIVERT) 12.5 MG tablet  3 times daily PRN        10/23/20 1215    ondansetron (ZOFRAN ODT) 4 MG disintegrating tablet  Every 8 hours PRN        10/23/20 1215           Jeannie Fend, PA-C 10/23/20 1219    Long, Arlyss Repress, MD 10/26/20 0725

## 2020-10-23 NOTE — Discharge Instructions (Addendum)
Home to rest. Follow up with your PCP or your neurologist.  Take Zofran as needed as prescribed for nausea and vomiting.  Take Meclizine as prescribed as needed. This medication has a fall risk, use caution when taking and have someone with you.

## 2020-10-23 NOTE — ED Notes (Signed)
Pt discharged to home. Discharge instructions have been discussed with patient and/or family members. Pt verbally acknowledges understanding d/c instructions, and endorses comprehension to checkout at registration before leaving.  °

## 2020-10-23 NOTE — ED Notes (Signed)
ED Provider at bedside. 

## 2020-10-23 NOTE — ED Notes (Signed)
Patient transported to CT 

## 2020-10-23 NOTE — ED Triage Notes (Signed)
Pt arrives POV with driver.  Reports a fall on Saturday, hit head on left side no LOC.  Now reports intermittent dizziness, nausea, vomiting, and diarrhea.  Denies chest pain.

## 2020-10-23 NOTE — ED Notes (Signed)
Pt endorses inability to provide stool at this time

## 2020-11-09 ENCOUNTER — Emergency Department (HOSPITAL_BASED_OUTPATIENT_CLINIC_OR_DEPARTMENT_OTHER)
Admission: EM | Admit: 2020-11-09 | Discharge: 2020-11-09 | Disposition: A | Discharge status: Home / Self Care | Payer: Medicare HMO | Attending: Emergency Medicine | Admitting: Emergency Medicine

## 2020-11-09 ENCOUNTER — Encounter (HOSPITAL_BASED_OUTPATIENT_CLINIC_OR_DEPARTMENT_OTHER): Payer: Self-pay

## 2020-11-09 ENCOUNTER — Other Ambulatory Visit: Payer: Self-pay

## 2020-11-09 DIAGNOSIS — R42 Dizziness and giddiness: Secondary | ICD-10-CM | POA: Insufficient documentation

## 2020-11-09 DIAGNOSIS — R519 Headache, unspecified: Secondary | ICD-10-CM | POA: Diagnosis present

## 2020-11-09 DIAGNOSIS — E119 Type 2 diabetes mellitus without complications: Secondary | ICD-10-CM | POA: Insufficient documentation

## 2020-11-09 DIAGNOSIS — F17210 Nicotine dependence, cigarettes, uncomplicated: Secondary | ICD-10-CM | POA: Insufficient documentation

## 2020-11-09 DIAGNOSIS — Z79899 Other long term (current) drug therapy: Secondary | ICD-10-CM | POA: Insufficient documentation

## 2020-11-09 DIAGNOSIS — I10 Essential (primary) hypertension: Secondary | ICD-10-CM | POA: Insufficient documentation

## 2020-11-09 MED ORDER — SODIUM CHLORIDE 0.9 % IV BOLUS
500.0000 mL | Freq: Once | INTRAVENOUS | Status: AC
  Administered 2020-11-09: 500 mL via INTRAVENOUS

## 2020-11-09 MED ORDER — METOCLOPRAMIDE HCL 5 MG/ML IJ SOLN
10.0000 mg | Freq: Once | INTRAMUSCULAR | Status: AC
  Administered 2020-11-09: 10 mg via INTRAVENOUS
  Filled 2020-11-09: qty 2

## 2020-11-09 MED ORDER — KETOROLAC TROMETHAMINE 15 MG/ML IJ SOLN
15.0000 mg | Freq: Once | INTRAMUSCULAR | Status: AC
  Administered 2020-11-09: 15 mg via INTRAVENOUS
  Filled 2020-11-09: qty 1

## 2020-11-09 MED ORDER — DIPHENHYDRAMINE HCL 50 MG/ML IJ SOLN
25.0000 mg | Freq: Once | INTRAMUSCULAR | Status: AC
  Administered 2020-11-09: 25 mg via INTRAVENOUS
  Filled 2020-11-09: qty 1

## 2020-11-09 NOTE — Discharge Instructions (Signed)
I have provided an ambulatory referral for neurology, please schedule an appointment in order to further manage your recurrent headaches.  If you experience any changes in vision, nausea, vomiting please return to the emergency department.

## 2020-11-09 NOTE — ED Triage Notes (Signed)
Pt arrives with c/o headache X1 month, states she had fallen a few days prior to her headaches starting, no LOC, no blood thinner. Pt reports that she was seen here after the fall and was given Meclizine which she has taken for the past month but reports "the headache will not go away".

## 2020-11-09 NOTE — ED Provider Notes (Signed)
[facility] EMERGENCY DEPARTMENT Provider Note   CSN: [id number] Arrival date & time: 11/09/20  1059     History Chief Complaint  Patient presents with  . Headache    Alison Garcia is a 80 y.o. female.  80 y.o female with a PMH of DM, HTN, high cholesterol presents to the ED with a chief complaint of ongoing headache for the past 3 weeks. Patient was evaluated in the ED after a fall on 4/26, she had a negative CT scan after this fall.  She endorses daily intermittent headaches that have been worsening since then.  Reports she was given a prescription for meclizine, and states that she is able to perform her ADLs however feels that the headache is otherwise constant.  Reports in the last 2 days the headache has worsened, was taking meclizine without any improvement at all.  States that she feels like the meclizine likely makes the headache better but once he returns the symptoms continue to worsen.  In addition, she has taken some Tylenol for improvement with mild relief.  He has been seen by neurology in the past for recurrent headaches, however was not given any medication for prophylactic treatment.  He does not have any prior history of migraines diagnosed, does not take any medication for this.  She denies any history of prior aneurysms, no prior history of CVA, no focal weakness, no nausea, or vomiting.  No additional trauma.    The history is provided by the patient.  Headache Pain location:  Frontal Quality:  Unable to specify Radiates to:  Does not radiate Severity at highest:  8/10 Onset quality:  Gradual Duration:  3 weeks Timing:  Constant Progression:  Worsening Context: activity   Relieved by:  Nothing Worsened by:  Activity Ineffective treatments:  Prescription medications Associated symptoms: dizziness   Associated symptoms: no abdominal pain, no blurred vision, no diarrhea, no fever, no focal weakness, no loss of balance, no myalgias, no nausea, no  near-syncope, no neck stiffness, no photophobia, no syncope and no vomiting        Past Medical History:  Diagnosis Date  . Diabetes mellitus without complication (HCC)   . High cholesterol   . Hypertension    pt denies  . Vertigo     There are no problems to display for this patient.   Past Surgical History:  Procedure Laterality Date  . ABDOMINAL HYSTERECTOMY       OB History   No obstetric history on file.     No family history on file.  Social History   Tobacco Use  . Smoking status: Current Some Day Smoker    Packs/day: 0.50    Types: Cigarettes  . Smokeless tobacco: Never Used  Substance Use Topics  . Alcohol use: No  . Drug use: No    Home Medications Prior to Admission medications   Medication Sig Start Date End Date Taking? Authorizing Provider  LOVASTATIN PO Take by mouth.   Yes [provider]  meclizine (ANTIVERT) 12.5 MG tablet Take 1 tablet (12.5 mg total) by mouth 3 (three) times daily as needed for dizziness. 10/23/20  Yes Jeannie Fend, PA-C  METFORMIN HCL PO Take by mouth.    [provider]  ondansetron (ZOFRAN ODT) 4 MG disintegrating tablet Take 1 tablet (4 mg total) by mouth every 8 (eight) hours as needed for nausea or vomiting. 10/23/20   Jeannie Fend, PA-C  Spacer/Aero-Holding Chambers (AEROCHAMBER PLUS WITH MASK) inhaler Use as  instructed 08/28/19   Arby Barrette, MD  albuterol (VENTOLIN HFA) 108 (90 Base) MCG/ACT inhaler Inhale 2 puffs into the lungs every 4 (four) hours as needed for wheezing or shortness of breath. 08/28/19 10/23/20  Arby Barrette, MD  LISINOPRIL PO Take by mouth.  10/23/20  [provider]  pantoprazole (PROTONIX) 20 MG tablet Take 1 tablet (20 mg total) by mouth daily. 08/28/19 10/23/20  Arby Barrette, MD    Allergies    Codeine and Hydrocodone bit-homatrop mbr  Review of Systems   Review of Systems  Constitutional: Negative for fever.  Eyes: Negative for blurred vision and  photophobia.  Respiratory: Negative for shortness of breath.   Cardiovascular: Negative for chest pain, syncope and near-syncope.  Gastrointestinal: Negative for abdominal pain, diarrhea, nausea and vomiting.  Genitourinary: Negative for flank pain.  Musculoskeletal: Negative for myalgias and neck stiffness.  Neurological: Positive for dizziness and headaches. Negative for focal weakness and loss of balance.  All other systems reviewed and are negative.   Physical Exam Updated Vital Signs BP (!) 110/58   Pulse 77   Temp 98 F (36.7 C) (Oral)   Resp 17   Ht 5\' 6"  (1.676 m)   Wt 54 kg   SpO2 97%   BMI 19.21 kg/m   Physical Exam Vitals and nursing note reviewed.  Constitutional:      Appearance: She is well-developed.  HENT:     Head: Normocephalic and atraumatic.  Eyes:     Extraocular Movements: Extraocular movements intact.     Right eye: Normal extraocular motion and no nystagmus.     Left eye: Normal extraocular motion and no nystagmus.     Pupils: Pupils are equal, round, and reactive to light.  Cardiovascular:     Rate and Rhythm: Normal rate.  Pulmonary:     Effort: Pulmonary effort is normal.     Breath sounds: No wheezing or rales.  Abdominal:     Palpations: Abdomen is soft.     Tenderness: There is no abdominal tenderness.  Musculoskeletal:     Cervical back: Normal range of motion and neck supple.  Skin:    General: Skin is warm and dry.  Neurological:     Mental Status: She is alert and oriented to person, place, and time.     GCS: GCS eye subscore is 4. GCS verbal subscore is 5. GCS motor subscore is 6.     Comments: Alert, oriented, thought content appropriate. Speech fluent without evidence of aphasia. Able to follow 2 step commands without difficulty.  Cranial Nerves:  II:  Peripheral visual fields grossly normal, pupils, round, reactive to light III,IV, VI: ptosis not present, extra-ocular motions intact bilaterally  V,VII: smile symmetric, facial  light touch sensation equal VIII: hearing grossly normal bilaterally  IX,X: midline uvula rise  XI: bilateral shoulder shrug equal and strong XII: midline tongue extension  Motor:  5/5 in upper and lower extremities bilaterally including strong and equal grip strength and dorsiflexion/plantar flexion Sensory: light touch normal in all extremities.  Cerebellar: normal finger-to-nose with bilateral upper extremities, pronator drift negative Gait: normal gait and balance     ED Results / Procedures / Treatments   Labs (all labs ordered are listed, but only abnormal results are displayed) Labs Reviewed - No data to display  EKG None  Radiology No results found.  Procedures Procedures   Medications Ordered in ED Medications  metoCLOPramide (REGLAN) injection 10 mg (10 mg Intravenous Given 11/09/20 1305)  diphenhydrAMINE (BENADRYL)  injection 25 mg (25 mg Intravenous Given 11/09/20 1305)  sodium chloride 0.9 % bolus 500 mL ( Intravenous Stopped 11/09/20 1405)  ketorolac (TORADOL) 15 MG/ML injection 15 mg (15 mg Intravenous Given 11/09/20 1305)    ED Course  I have reviewed the triage vital signs and the nursing notes.  Pertinent labs & imaging results that were available during my care of the patient were reviewed by me and considered in my medical decision making (see chart for details).    MDM Rules/Calculators/A&P     Patient presents to the ED with a chief complaint of frontal headache for the past 3 weeks.  Originally evaluated in the ED on 426 after a mechanical fall.  Had a negative CT which did not show any acute abnormality.  She reports she was given a prescription for meclizine, with a prior history of vertigo.  States that this medication makes the headaches subside however it has not been completely resolved in the past 3 weeks.  States that the headache is constant, she feels like it affects her ADLs.  In addition, she reports priorly followed up with neurology, who  recommended she symptomatically treat the headache.  She was not given any medication for prophylactic treatment.  States that she had an MRI in the past which did not show any acute findings.  I have extensively looked to her records and I do not see any previous MRI.  I do see a normal CT during her visit to the ED on 426.  She denies any focal weakness, nausea, vomiting, neurological components.  She is ambulatory in the ED with a steady gait.  During evaluation in the ED, patient arrived with normal vital signs, afebrile, without any neck rigidity.  Neuro exam is unremarkable.  Lungs are clear to auscultation.  Abdomen is soft nontender to palpation.  We discussed reimaging her head in order to rule out any subdural hematoma after the fall, she is deferring this at this time.  If she does not feel like she has any focal components to it.  In addition, she reports she has had a headache for several months at this time, as this was previously followed with neurology.  She is requesting medication for symptomatic treatment at this time.  We did discuss the risks and benefits of this medication.  2:09 PM Patient reevaluated after receiving medication for pain control.  Does report the headache has improved in nature.  We discussed again CT imaging, however patient does not wish to have this done on today's visit.  She does report the headaches have started been chronic for the last couple of weeks and prior to.  I will provide her with a referral for neurology.  We discussed continued treatment with Tylenol.  Strict return precautions provided, vitals remained within normal limits.  Patient stable for discharge  Portions of this note were generated with Dragon dictation software. Dictation errors may occur despite best attempts at proofreading.  Final Clinical Impression(s) / ED Diagnoses Final diagnoses:  Bad headache    Rx / DC Orders ED Discharge Orders         Ordered    Ambulatory referral to  Neurology       Comments: An appointment is requested in approximately: 1week   11/09/20 1411           Claude Manges, PA-C 11/09/20 1411    Sabino Donovan, MD 11/09/20 1435

## 2021-04-02 ENCOUNTER — Encounter (HOSPITAL_BASED_OUTPATIENT_CLINIC_OR_DEPARTMENT_OTHER): Payer: Self-pay

## 2021-04-02 ENCOUNTER — Other Ambulatory Visit: Payer: Self-pay

## 2021-04-02 ENCOUNTER — Emergency Department (HOSPITAL_BASED_OUTPATIENT_CLINIC_OR_DEPARTMENT_OTHER)
Admission: EM | Admit: 2021-04-02 | Discharge: 2021-04-02 | Disposition: A | Discharge status: Home / Self Care | Payer: Medicare HMO | Attending: Emergency Medicine | Admitting: Emergency Medicine

## 2021-04-02 DIAGNOSIS — M25552 Pain in left hip: Secondary | ICD-10-CM | POA: Diagnosis not present

## 2021-04-02 DIAGNOSIS — Z7984 Long term (current) use of oral hypoglycemic drugs: Secondary | ICD-10-CM | POA: Insufficient documentation

## 2021-04-02 DIAGNOSIS — M5442 Lumbago with sciatica, left side: Secondary | ICD-10-CM | POA: Diagnosis not present

## 2021-04-02 DIAGNOSIS — I10 Essential (primary) hypertension: Secondary | ICD-10-CM | POA: Diagnosis not present

## 2021-04-02 DIAGNOSIS — Z79899 Other long term (current) drug therapy: Secondary | ICD-10-CM | POA: Diagnosis not present

## 2021-04-02 DIAGNOSIS — F17210 Nicotine dependence, cigarettes, uncomplicated: Secondary | ICD-10-CM | POA: Insufficient documentation

## 2021-04-02 DIAGNOSIS — E119 Type 2 diabetes mellitus without complications: Secondary | ICD-10-CM | POA: Diagnosis not present

## 2021-04-02 DIAGNOSIS — M545 Low back pain, unspecified: Secondary | ICD-10-CM | POA: Diagnosis present

## 2021-04-02 DIAGNOSIS — M25559 Pain in unspecified hip: Secondary | ICD-10-CM

## 2021-04-02 MED ORDER — PREDNISONE 10 MG (21) PO TBPK: ORAL_TABLET | Freq: Every day | ORAL | 0 refills | Status: AC

## 2021-04-02 MED ORDER — KETOROLAC TROMETHAMINE 15 MG/ML IJ SOLN
15.0000 mg | Freq: Once | INTRAMUSCULAR | Status: AC
  Administered 2021-04-02: 15 mg via INTRAMUSCULAR
  Filled 2021-04-02: qty 1

## 2021-04-02 MED ORDER — ACETAMINOPHEN 325 MG PO TABS
650.0000 mg | ORAL_TABLET | Freq: Once | ORAL | Status: AC
  Administered 2021-04-02: 650 mg via ORAL
  Filled 2021-04-02: qty 2

## 2021-04-02 NOTE — ED Provider Notes (Signed)
[facility] EMERGENCY DEPARTMENT Provider Note   CSN: [id number] Arrival date & time: 04/02/21  1808     History Chief Complaint  Patient presents with   Back Pain    Alison Garcia is a 80 y.o. female with no significant past medical history who presents with 3 weeks of worsening left lumbar lower back pain with radiation down her left leg.  Patient denies history of similar back pain or sciatica.  Patient denies inciting injury.  Patient is able to walk without difficulty, with some pain.  Patient denies chronic corticosteroid use, recent fever, history of cancer, intravenous drug use.  Patient denies any numbness or tingling other than the shooting pain when she stands.  Patient reports that she has tried Tylenol arthritis, as well as muscle relaxant for several days with no relief.  Patient reports that she has not tried ibuprofen because she was told not to given her history of diabetes.   Back Pain     Past Medical History:  Diagnosis Date   Diabetes mellitus without complication (HCC)    High cholesterol    Hypertension    pt denies   Vertigo     There are no problems to display for this patient.   Past Surgical History:  Procedure Laterality Date   ABDOMINAL HYSTERECTOMY       OB History   No obstetric history on file.     History reviewed. No pertinent family history.  Social History   Tobacco Use   Smoking status: Some Days    Packs/day: 0.50    Types: Cigarettes   Smokeless tobacco: Never  Substance Use Topics   Alcohol use: No   Drug use: No    Home Medications Prior to Admission medications   Medication Sig Start Date End Date Taking? Authorizing Provider  predniSONE (STERAPRED UNI-PAK 21 TAB) 10 MG (21) TBPK tablet Take by mouth daily. Take 6 tabs by mouth daily  for 2 days, then 5 tabs for 2 days, then 4 tabs for 2 days, then 3 tabs for 2 days, 2 tabs for 2 days, then 1 tab by mouth daily for 2 days 04/02/21  Yes Lylia Karn  H, PA-C  LOVASTATIN PO Take by mouth.    [provider]  meclizine (ANTIVERT) 12.5 MG tablet Take 1 tablet (12.5 mg total) by mouth 3 (three) times daily as needed for dizziness. 10/23/20   Jeannie Fend, PA-C  METFORMIN HCL PO Take by mouth.    [provider]  ondansetron (ZOFRAN ODT) 4 MG disintegrating tablet Take 1 tablet (4 mg total) by mouth every 8 (eight) hours as needed for nausea or vomiting. 10/23/20   Jeannie Fend, PA-C  Spacer/Aero-Holding Chambers (AEROCHAMBER PLUS WITH MASK) inhaler Use as instructed 08/28/19   Arby Barrette, MD  albuterol (VENTOLIN HFA) 108 (90 Base) MCG/ACT inhaler Inhale 2 puffs into the lungs every 4 (four) hours as needed for wheezing or shortness of breath. 08/28/19 10/23/20  Arby Barrette, MD  LISINOPRIL PO Take by mouth.  10/23/20  [provider]  pantoprazole (PROTONIX) 20 MG tablet Take 1 tablet (20 mg total) by mouth daily. 08/28/19 10/23/20  Arby Barrette, MD    Allergies    Codeine and Hydrocodone bit-homatrop mbr  Review of Systems   Review of Systems  Musculoskeletal:  Positive for back pain.  All other systems reviewed and are negative.  Physical Exam Updated Vital Signs BP 126/69 (BP Location: Left Arm)   Pulse 78  Temp 98.7 F (37.1 C) (Oral)   Resp 16   Ht 5\' 6"  (1.676 m)   Wt 51.7 kg   SpO2 100%   BMI 18.40 kg/m   Physical Exam Vitals and nursing note reviewed.  Constitutional:      General: She is not in acute distress.    Appearance: Normal appearance.  HENT:     Head: Normocephalic and atraumatic.  Eyes:     General:        Right eye: No discharge.        Left eye: No discharge.  Cardiovascular:     Rate and Rhythm: Normal rate and regular rhythm.     Comments: Intact DP and PT pulses of the left leg. Pulmonary:     Effort: Pulmonary effort is normal. No respiratory distress.  Musculoskeletal:        General: No deformity.     Comments: No midline spinal tenderness.  There is  some tenderness to palpation of the lumbar spine on the left.  There is no redness or swelling throughout the entirety of the back.  Patient has normal range of motion of the lumbar spine.  Patient also has some tenderness to palpation over the left trochanteric bursa.  Some tenderness to palpation left leg.  Patient has 4 out of 5 strength on the left compared to the right secondary to pain, she is able to ambulate without difficulty with some pain.  Normal sensation throughout the entirety of the left leg.  Skin:    General: Skin is warm and dry.     Capillary Refill: Capillary refill takes less than 2 seconds.  Neurological:     Mental Status: She is alert and oriented to person, place, and time.     Sensory: No sensory deficit.  Psychiatric:        Mood and Affect: Mood normal.        Behavior: Behavior normal.    ED Results / Procedures / Treatments   Labs (all labs ordered are listed, but only abnormal results are displayed) Labs Reviewed - No data to display  EKG None  Radiology No results found.  Procedures Procedures   Medications Ordered in ED Medications  ketorolac (TORADOL) 15 MG/ML injection 15 mg (15 mg Intramuscular Given 04/02/21 2148)  acetaminophen (TYLENOL) tablet 650 mg (650 mg Oral Given 04/02/21 2148)    ED Course  I have reviewed the triage vital signs and the nursing notes.  Pertinent labs & imaging results that were available during my care of the patient were reviewed by me and considered in my medical decision making (see chart for details).    MDM Rules/Calculators/A&P                         Patient has signs and symptoms of lumbar back pain with sciatica, plus or minus trochanteric bursitis.  Patient has no red flags of back pain including chronic corticosteroid use, history of cancer, fever, intravenous drug use.  Patient with no worrisome neurologic deficits, normal sensation.  Patient has no signs or symptoms of cauda equina syndrome including  saddle anesthesia, urinary retention, issues with defecation.  Patient has tried and failed Tylenol and muscle accident this time.  Will control patient's pain today with Tylenol, Toradol injection.  Recommend escalation to steroid taper at this time given prolonged pain, and failure of first-line medications.  Did discuss the patient should be able to use some ibuprofen for additional  anti-inflammatory effect especially if she has no history of kidney disease, and has had normal kidney function at recent testing.  Recommend stretching and exercises, follow-up with orthopedics if pain worsens or fails to improve.  Patient discharged in stable condition.   Final Clinical Impression(s) / ED Diagnoses Final diagnoses:  Acute left-sided low back pain with left-sided sciatica  Hip pain    Rx / DC Orders ED Discharge Orders          Ordered    predniSONE (STERAPRED UNI-PAK 21 TAB) 10 MG (21) TBPK tablet  Daily        04/02/21 2144             Olene Floss, PA-C 04/03/21 0029    Rolan Bucco, MD 04/11/21 1404

## 2021-04-02 NOTE — ED Triage Notes (Signed)
Pt c/o left lumbar back pain radiating down leg x 3 weeks. Ambulatory to triage without assistance.

## 2021-04-02 NOTE — ED Notes (Signed)
ED Provider at bedside. 

## 2021-04-02 NOTE — Discharge Instructions (Addendum)
Please use Tylenol or ibuprofen for pain.  You may use 600 mg ibuprofen every 6 hours or 1000 mg of Tylenol every 6 hours.  You may choose to alternate between the 2.  This would be most effective.  Not to exceed 4 g of Tylenol within 24 hours.  Not to exceed 3200 mg ibuprofen 24 hours.  You may take your muscle relaxant throughout the day if it is not making you sleepy.  Please follow up with Dr. Jordan Likes if your pain does not improve.

## 2021-05-19 IMAGING — DX DG CHEST 1V PORT
1 series · 1 of 1 positions shown · non-contrast
Comparison: CT 03/19/2020.  Chest X 08/28/2019.

CLINICAL DATA: Weakness.

EXAM:
PORTABLE CHEST 1 VIEW

[chest ap]
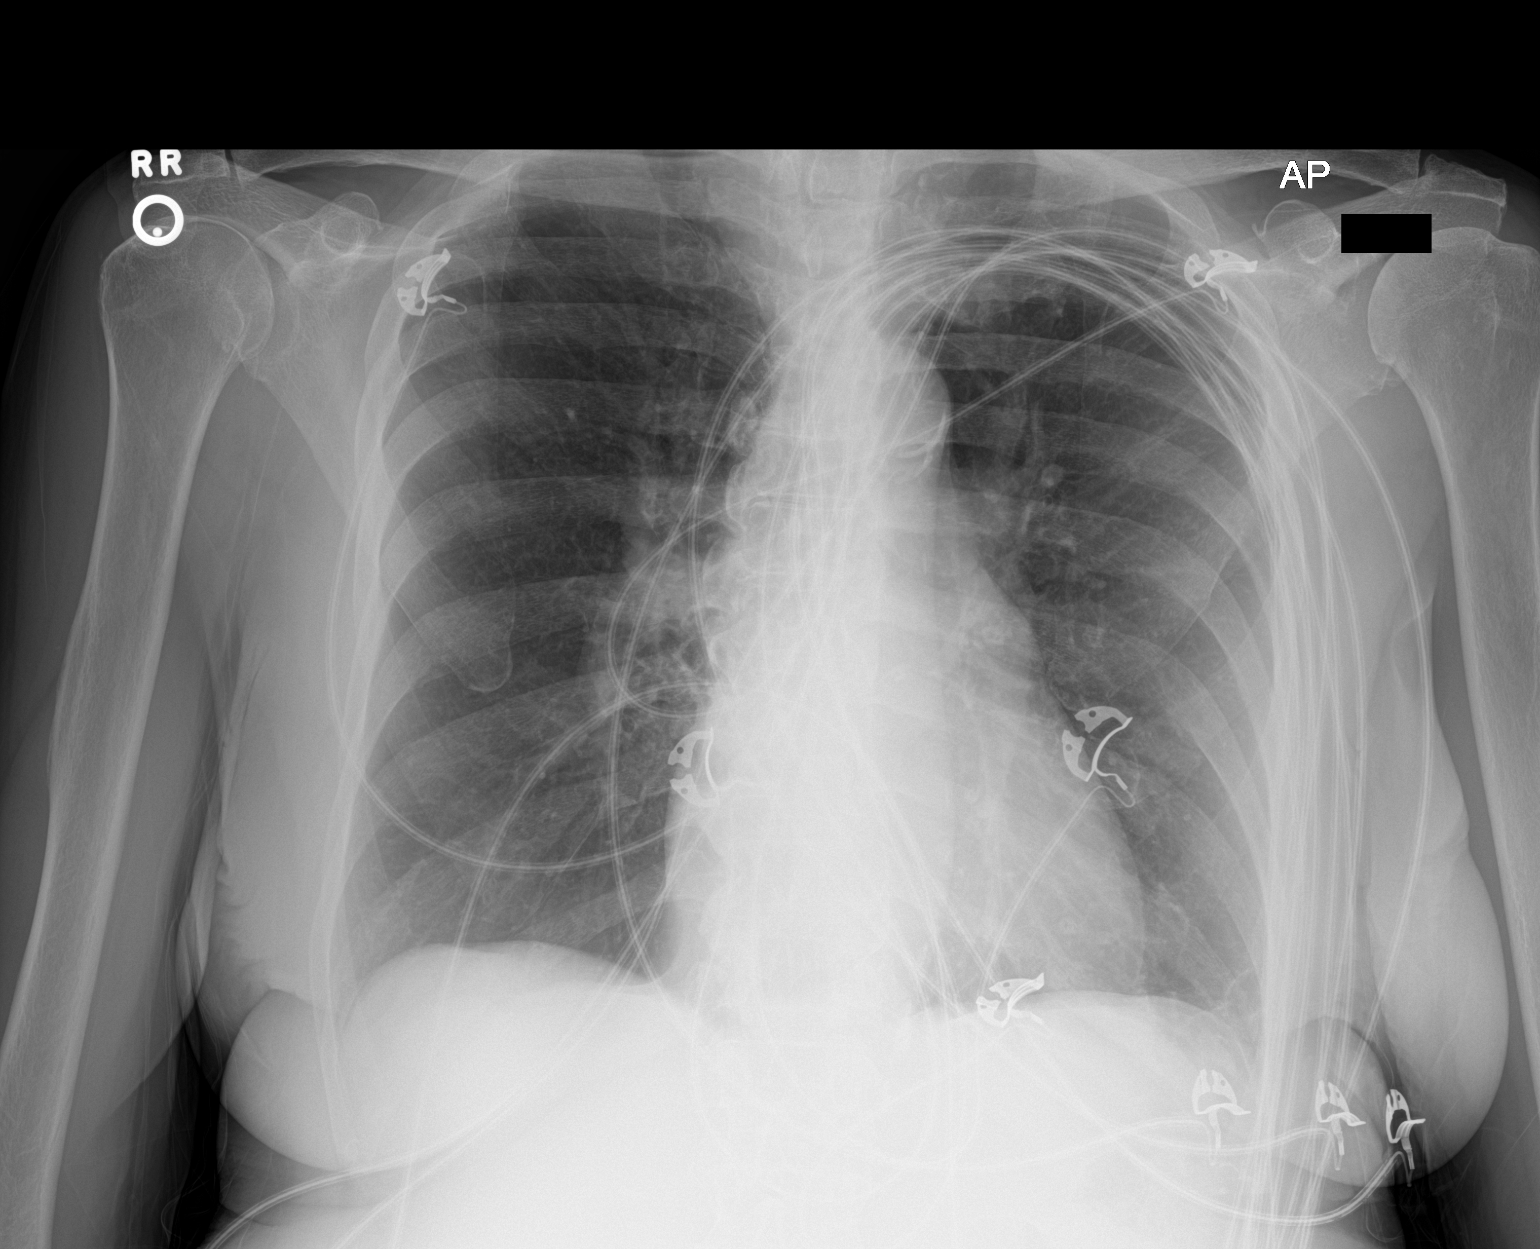

[1 of 1 positions shown; findings below may reference images not displayed]

FINDINGS: Mediastinum and hilar structures normal. Heart size stable. No focal
infiltrate. Interim resolution of previously identified right base
atelectasis. No pleural effusion or pneumothorax. Degenerative
change thoracic spine.
IMPRESSION: No acute cardiopulmonary disease.

## 2021-05-19 IMAGING — CT CT HEAD W/O CM
4 series · 17 of 47 positions shown, 19 images · non-contrast
Comparison: Head CT scan 01/09/2017.

CLINICAL DATA: Patient status post fall with a blow to the head
10/20/2020. Initial encounter.

EXAM:
CT HEAD WITHOUT CONTRAST
TECHNIQUE: Contiguous axial images were obtained from the base of the skull
through the vertex without intravenous contrast.

[Series 2: head wo · axial · 0.42mm/px · z∈[-540,-420]mm · 7 of 34 slices shown, 9 images]
[im 5/34  brain]
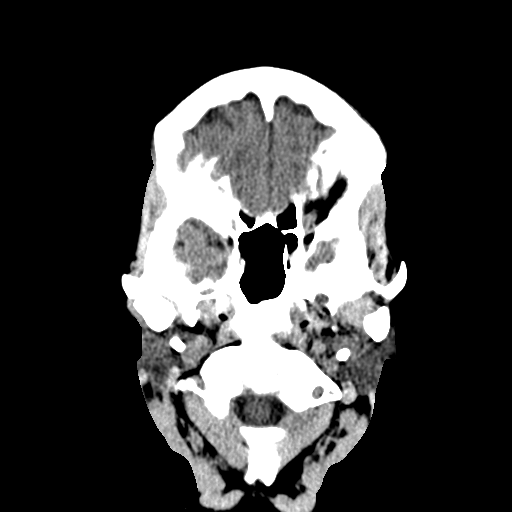
[im 5/34  bone]
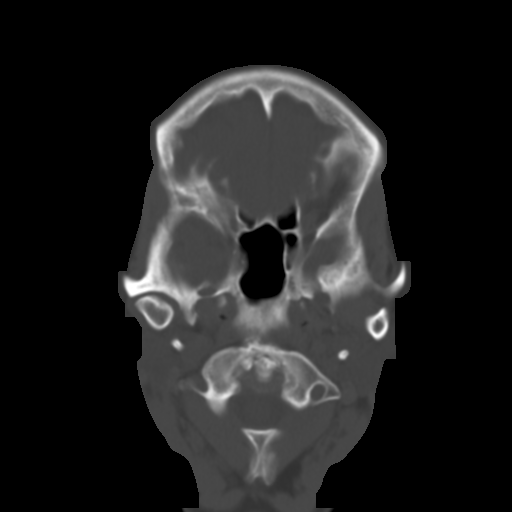
[im 9/34  brain]
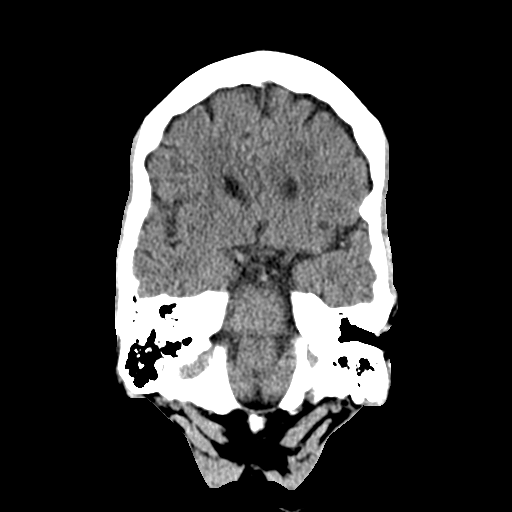
[im 13/34  brain]
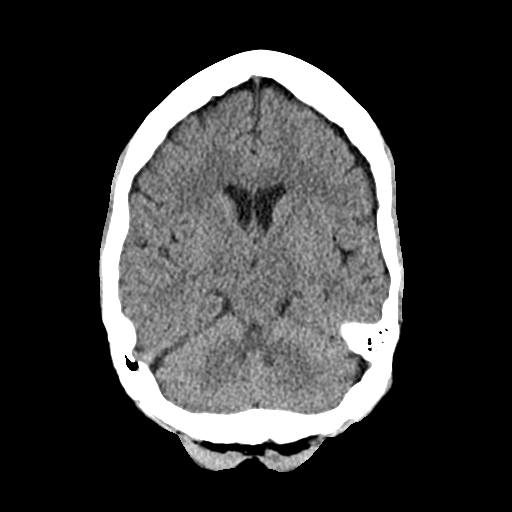
[im 17/34  brain]
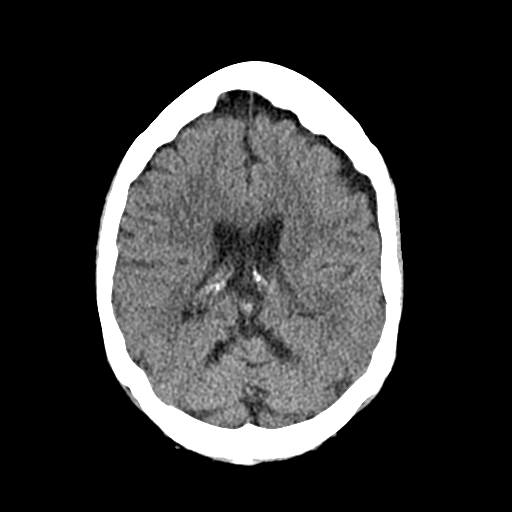
[im 21/34  brain]
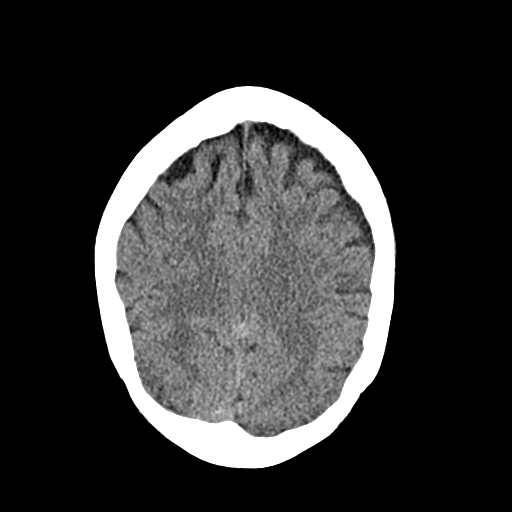
[im 21/34  bone]
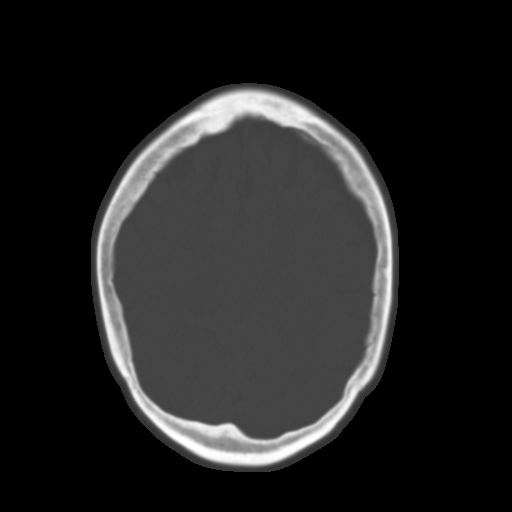
[im 25/34  brain]
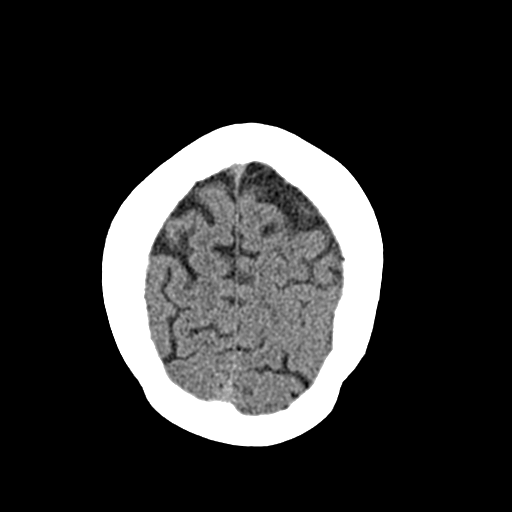
[im 29/34  brain]
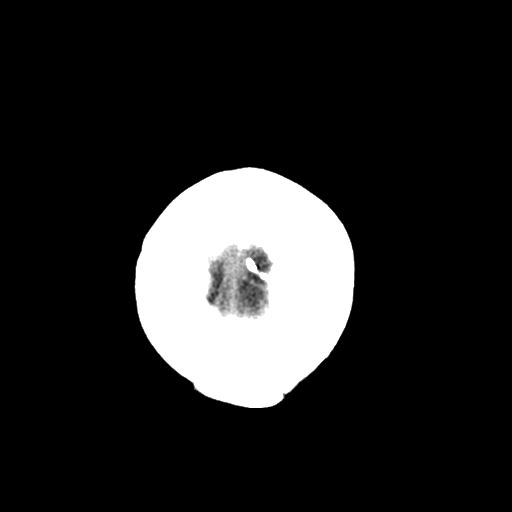

[Series 3: head bone · axial · 0.42mm/px · z∈[-544,-486]mm · 4 of 85 slices shown]
[im 9/85  bone]
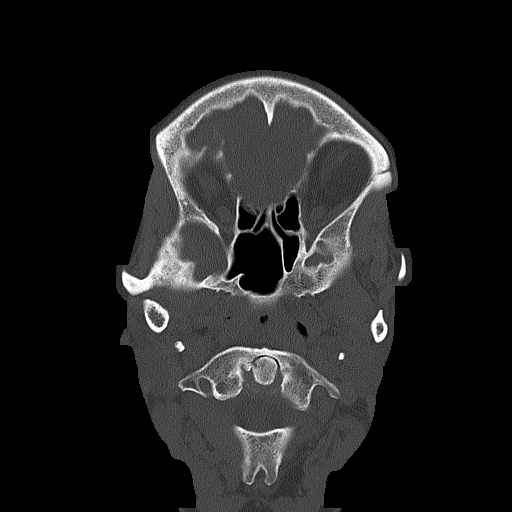
[im 17/85  bone]
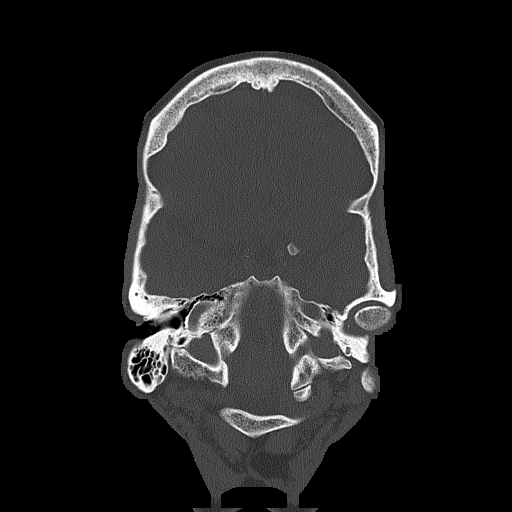
[im 26/85  bone]
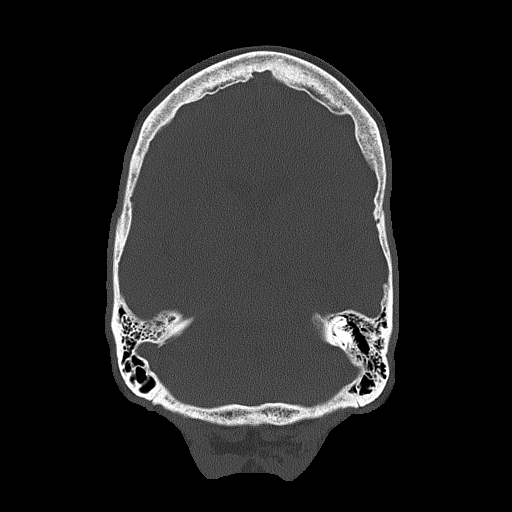
[im 38/85  bone]
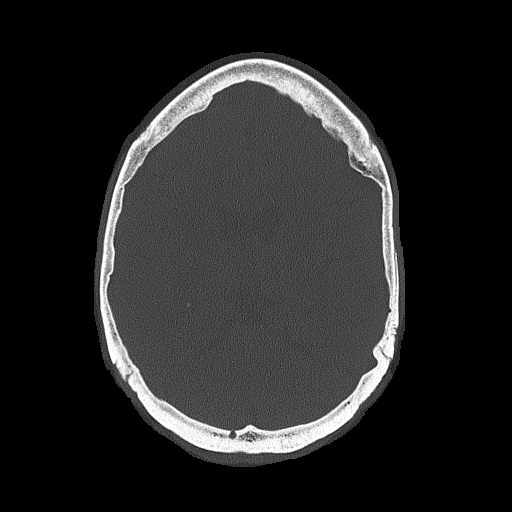

[Series 4: cor soft · coronal · 0.33mm/px · 3 of 62 slices shown]
[im 21/62  brain]
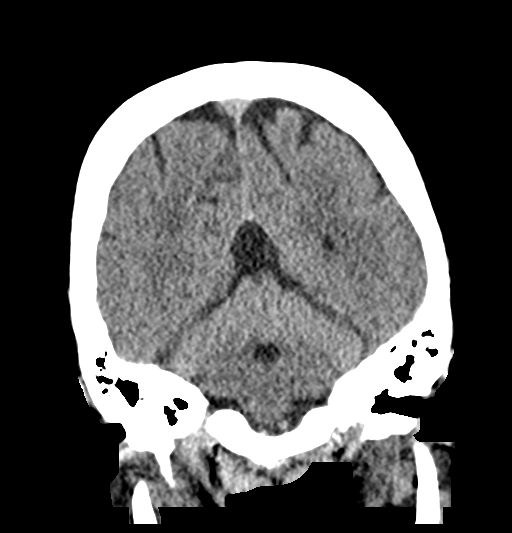
[im 28/62  brain]
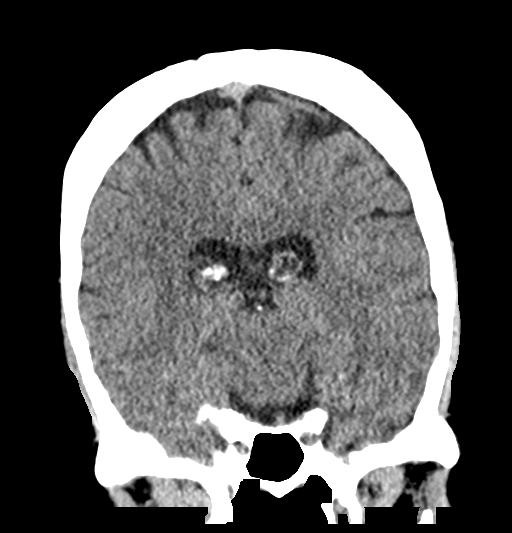
[im 34/62  brain]
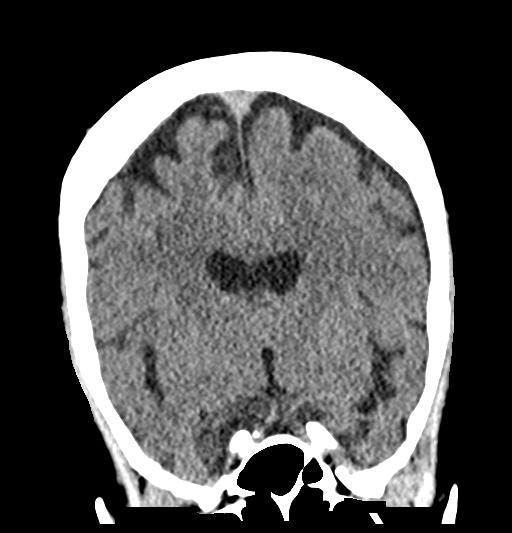

[Series 5: sag soft · sagittal · 0.35mm/px · 3 of 49 slices shown]
[im 17/49  brain]
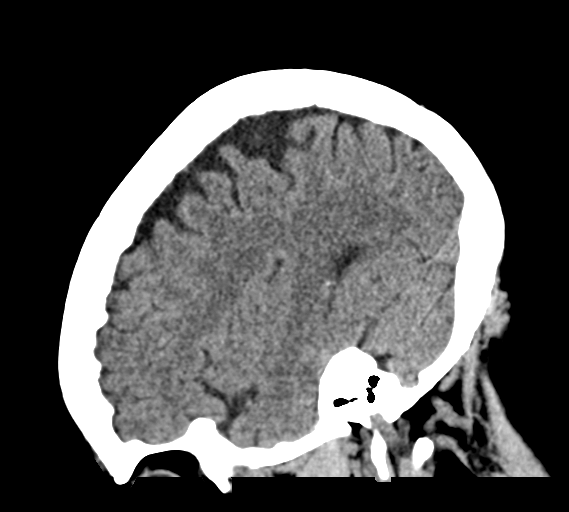
[im 25/49  brain]
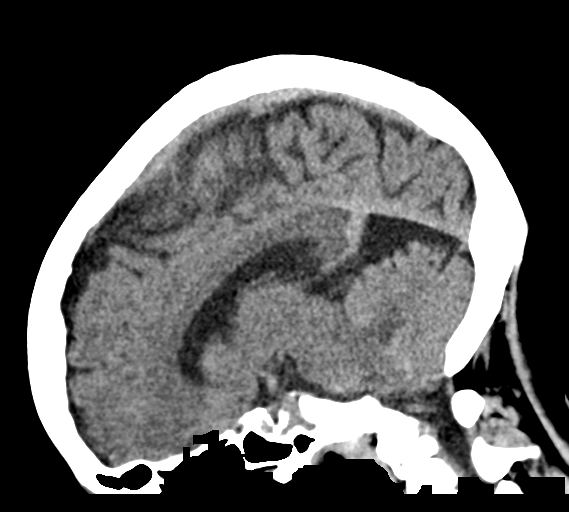
[im 33/49  brain]
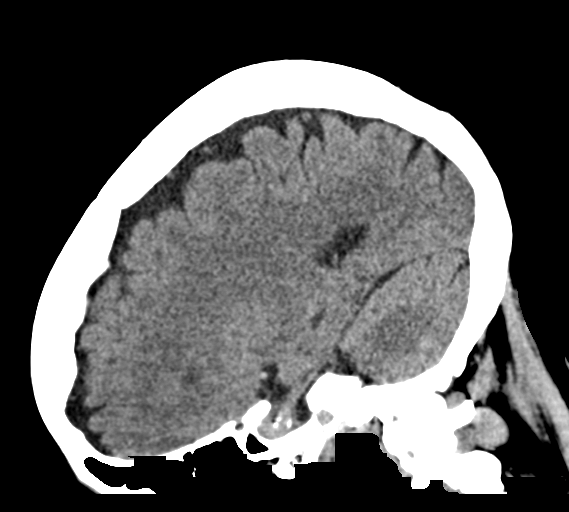

[17 of 47 positions shown; findings below may reference images not displayed]

FINDINGS: Brain: No evidence of acute infarction, hemorrhage, hydrocephalus,
extra-axial collection or mass lesion/mass effect. Very mild
cortical atrophy is unchanged.

Vascular: No hyperdense vessel or unexpected calcification.

Skull: Intact.  No focal lesion.

Sinuses/Orbits: Negative.

Other: None.
IMPRESSION: Negative head CT.

## 2023-01-29 ENCOUNTER — Other Ambulatory Visit: Payer: Self-pay

## 2023-01-29 ENCOUNTER — Emergency Department (HOSPITAL_BASED_OUTPATIENT_CLINIC_OR_DEPARTMENT_OTHER): Payer: Medicare HMO

## 2023-01-29 ENCOUNTER — Emergency Department (HOSPITAL_BASED_OUTPATIENT_CLINIC_OR_DEPARTMENT_OTHER)
Admission: EM | Admit: 2023-01-29 | Discharge: 2023-01-29 | Discharge status: Against Medical Advice | Payer: Medicare HMO | Attending: Emergency Medicine | Admitting: Emergency Medicine

## 2023-01-29 ENCOUNTER — Encounter (HOSPITAL_BASED_OUTPATIENT_CLINIC_OR_DEPARTMENT_OTHER): Payer: Self-pay | Admitting: Pediatrics

## 2023-01-29 DIAGNOSIS — Z79899 Other long term (current) drug therapy: Secondary | ICD-10-CM | POA: Diagnosis not present

## 2023-01-29 DIAGNOSIS — X58XXXA Exposure to other specified factors, initial encounter: Secondary | ICD-10-CM | POA: Insufficient documentation

## 2023-01-29 DIAGNOSIS — I10 Essential (primary) hypertension: Secondary | ICD-10-CM | POA: Insufficient documentation

## 2023-01-29 DIAGNOSIS — F17200 Nicotine dependence, unspecified, uncomplicated: Secondary | ICD-10-CM | POA: Insufficient documentation

## 2023-01-29 DIAGNOSIS — R0602 Shortness of breath: Secondary | ICD-10-CM | POA: Insufficient documentation

## 2023-01-29 DIAGNOSIS — S2231XA Fracture of one rib, right side, initial encounter for closed fracture: Secondary | ICD-10-CM | POA: Insufficient documentation

## 2023-01-29 DIAGNOSIS — J90 Pleural effusion, not elsewhere classified: Secondary | ICD-10-CM | POA: Insufficient documentation

## 2023-01-29 DIAGNOSIS — J181 Lobar pneumonia, unspecified organism: Secondary | ICD-10-CM | POA: Insufficient documentation

## 2023-01-29 DIAGNOSIS — R0789 Other chest pain: Secondary | ICD-10-CM | POA: Diagnosis present

## 2023-01-29 DIAGNOSIS — Z1152 Encounter for screening for COVID-19: Secondary | ICD-10-CM | POA: Diagnosis not present

## 2023-01-29 DIAGNOSIS — J189 Pneumonia, unspecified organism: Secondary | ICD-10-CM

## 2023-01-29 LAB — COMPREHENSIVE METABOLIC PANEL
ALT: 14 U/L (ref 0–44)
AST: 19 U/L (ref 15–41)
Albumin: 3.8 g/dL (ref 3.5–5.0)
Alkaline Phosphatase: 57 U/L (ref 38–126)
Anion gap: 7 (ref 5–15)
BUN: 11 mg/dL (ref 8–23)
CO2: 31 mmol/L (ref 22–32)
Calcium: 10.2 mg/dL (ref 8.9–10.3)
Chloride: 100 mmol/L (ref 98–111)
Creatinine, Ser: 0.8 mg/dL (ref 0.44–1.00)
GFR, Estimated: >60 mL/min (ref >60)
Glucose, Bld: 88 mg/dL (ref 70–99)
Potassium: 4.7 mmol/L (ref 3.5–5.1)
Sodium: 138 mmol/L (ref 135–145)
Total Bilirubin: 0.4 mg/dL (ref 0.3–1.2)
Total Protein: 7.3 g/dL (ref 6.5–8.1)

## 2023-01-29 LAB — CBC
HCT: 36.6 % (ref 36.0–46.0)
Hemoglobin: 12 g/dL (ref 12.0–15.0)
MCH: 29.7 pg (ref 26.0–34.0)
MCHC: 32.8 g/dL (ref 30.0–36.0)
MCV: 90.6 fL (ref 80.0–100.0)
Platelets: 479 10*3/uL — ABNORMAL HIGH (ref 150–400)
RBC: 4.04 MIL/uL (ref 3.87–5.11)
RDW: 13.5 % (ref 11.5–15.5)
WBC: 7.2 10*3/uL (ref 4.0–10.5)
nRBC: 0 % (ref 0.0–0.2)

## 2023-01-29 LAB — SARS CORONAVIRUS 2 BY RT PCR: SARS Coronavirus 2 by RT PCR: NEGATIVE

## 2023-01-29 LAB — BRAIN NATRIURETIC PEPTIDE: B Natriuretic Peptide: 59.5 pg/mL (ref 0.0–100.0)

## 2023-01-29 MED ORDER — TRAMADOL HCL 50 MG PO TABS: 50.0000 mg | ORAL_TABLET | Freq: Four times a day (QID) | ORAL | 0 refills | Status: AC | PRN

## 2023-01-29 MED ORDER — AZITHROMYCIN 250 MG PO TABS
500.0000 mg | ORAL_TABLET | Freq: Once | ORAL | Status: AC
  Administered 2023-01-29: 500 mg via ORAL
  Filled 2023-01-29: qty 2

## 2023-01-29 MED ORDER — SODIUM CHLORIDE 0.9 % IV SOLN
1.0000 g | Freq: Once | INTRAVENOUS | Status: AC
  Administered 2023-01-29: 1 g via INTRAVENOUS
  Filled 2023-01-29: qty 10

## 2023-01-29 MED ORDER — PROMETHAZINE-DM 6.25-15 MG/5ML PO SYRP: 1.2500 mL | ORAL_SOLUTION | Freq: Four times a day (QID) | ORAL | 0 refills | Status: AC | PRN

## 2023-01-29 MED ORDER — AZITHROMYCIN 250 MG PO TABS: ORAL_TABLET | ORAL | 0 refills | Status: AC

## 2023-01-29 NOTE — ED Notes (Signed)
Discharge paperwork reviewed entirely with patient, including follow up care. Pain was under control. The patient received instruction and coaching on their prescriptions, and all follow-up questions were answered.  Pt verbalized understanding as well as all parties involved. No questions or concerns voiced at the time of discharge. No acute distress noted.   Pt ambulated out to PVA without incident or assistance.  

## 2023-01-29 NOTE — Discharge Instructions (Addendum)
Contact a health care provider if: You have a fever. You have trouble sleeping because you cannot control your cough with cough medicine. Get help right away if: Your shortness of breath becomes worse. Your chest pain increases. Your sickness becomes worse, especially if you are an older adult or have a weak immune system. You cough up blood. These symptoms may be an emergency. Get help right away. Call 911. Do not wait to see if the symptoms will go away. Do not drive yourself to the hospital. 

## 2023-01-29 NOTE — ED Notes (Signed)
Pt obtaining urine specimen since she needs to urinate.

## 2023-01-29 NOTE — ED Triage Notes (Signed)
C/O productive cough x [redacted] weeks along with right sided rib pain, states cough is preventing her from sleeping. States "I can't breathe" worst when she has to cough.   Endorsed hx of vertigo and feels off for the last three weeks, off and on that makes her wobble.

## 2023-01-29 NOTE — ED Provider Notes (Signed)
[place] [facility]ERGENCY DEPARTMENT AT [facility] Provider Note   CSN: [id number] Arrival date & time: 01/29/23  0944     History  Chief Complaint  Patient presents with   Chest Pain   Cough   Dizziness    Alison Garcia is a 82 y.o. female who presents emergency department chief complaint of cough and chest pain x 2 weeks.  She is a daily smoker.  Patient states she has been coughing a lot.  Her cough is nonproductive.  Last night she was coughing really hard and she began having pain in the right rib.  Pain is worse with deep breathing.  She denies any significant shortness of breath   Chest Pain Associated symptoms: cough and dizziness   Cough Associated symptoms: chest pain   Dizziness Associated symptoms: chest pain        Home Medications Prior to Admission medications   Medication Sig Start Date End Date Taking? Authorizing Provider  azithromycin (ZITHROMAX) 250 MG tablet Take first 2 tablets together, then 1 every day until finished. 01/29/23  Yes Tata Timmins, PA-C  promethazine-dextromethorphan (PROMETHAZINE-DM) 6.25-15 MG/5ML syrup Take 1.3 mLs by mouth 4 (four) times daily as needed for cough. 01/29/23  Yes Annabell Oconnor, PA-C  traMADol (ULTRAM) 50 MG tablet Take 1 tablet (50 mg total) by mouth every 6 (six) hours as needed. 01/29/23  Yes Alexandrea Westergard, PA-C  LOVASTATIN PO Take by mouth.    [provider]  meclizine (ANTIVERT) 12.5 MG tablet Take 1 tablet (12.5 mg total) by mouth 3 (three) times daily as needed for dizziness. 10/23/20   Jeannie Fend, PA-C  METFORMIN HCL PO Take by mouth.    [provider]  ondansetron (ZOFRAN ODT) 4 MG disintegrating tablet Take 1 tablet (4 mg total) by mouth every 8 (eight) hours as needed for nausea or vomiting. 10/23/20   Jeannie Fend, PA-C  predniSONE (STERAPRED UNI-PAK 21 TAB) 10 MG (21) TBPK tablet Take by mouth daily. Take 6 tabs by mouth daily  for 2 days, then 5 tabs for 2 days, then 4 tabs  for 2 days, then 3 tabs for 2 days, 2 tabs for 2 days, then 1 tab by mouth daily for 2 days 04/02/21   Prosperi, Christian H, PA-C  Spacer/Aero-Holding Chambers (AEROCHAMBER PLUS WITH MASK) inhaler Use as instructed 08/28/19   Arby Barrette, MD  albuterol (VENTOLIN HFA) 108 (90 Base) MCG/ACT inhaler Inhale 2 puffs into the lungs every 4 (four) hours as needed for wheezing or shortness of breath. 08/28/19 10/23/20  Arby Barrette, MD  LISINOPRIL PO Take by mouth.  10/23/20  [provider]  pantoprazole (PROTONIX) 20 MG tablet Take 1 tablet (20 mg total) by mouth daily. 08/28/19 10/23/20  Arby Barrette, MD      Allergies    Codeine and Hydrocodone bit-homatrop mbr    Review of Systems   Review of Systems  Respiratory:  Positive for cough.   Cardiovascular:  Positive for chest pain.  Neurological:  Positive for dizziness.    Physical Exam Updated Vital Signs BP (!) 119/55   Pulse 88   Temp 98.4 F (36.9 C) (Oral)   Resp 18   Ht 5\' 6"  (1.676 m)   Wt 47.2 kg   SpO2 96%   BMI 16.79 kg/m  Physical Exam Vitals and nursing note reviewed.  Constitutional:      General: She is not in acute distress.    Appearance: She is well-developed. She is not  diaphoretic.  HENT:     Head: Normocephalic and atraumatic.     Right Ear: External ear normal.     Left Ear: External ear normal.     Nose: Nose normal.     Mouth/Throat:     Mouth: Mucous membranes are moist.  Eyes:     General: No scleral icterus.    Conjunctiva/sclera: Conjunctivae normal.  Cardiovascular:     Rate and Rhythm: Normal rate and regular rhythm.     Heart sounds: Normal heart sounds. No murmur heard.    No friction rub. No gallop.  Pulmonary:     Effort: Pulmonary effort is normal. No respiratory distress.     Breath sounds: Examination of the right-lower field reveals decreased breath sounds. Decreased breath sounds present.  Chest:     Chest wall: No tenderness or crepitus.  Abdominal:     General: Bowel  sounds are normal. There is no distension.     Palpations: Abdomen is soft. There is no mass.     Tenderness: There is no abdominal tenderness. There is no guarding.  Musculoskeletal:     Cervical back: Normal range of motion.  Skin:    General: Skin is warm and dry.  Neurological:     Mental Status: She is alert and oriented to person, place, and time.  Psychiatric:        Behavior: Behavior normal.     ED Results / Procedures / Treatments   Labs (all labs ordered are listed, but only abnormal results are displayed) Labs Reviewed  CBC - Abnormal; Notable for the following components:      Result Value   Platelets 479 (*)    All other components within normal limits  SARS CORONAVIRUS 2 BY RT PCR  COMPREHENSIVE METABOLIC PANEL  BRAIN NATRIURETIC PEPTIDE    EKG EKG Interpretation Date/Time:  Thursday January 29 2023 11:53:05 EDT Ventricular Rate:  88 PR Interval:  175 QRS Duration:  74 QT Interval:  334 QTC Calculation: 404 R Axis:   77  Text Interpretation: Sinus rhythm Anteroseptal infarct, age indeterminate Confirmed by Fulton Reek [phone]) on 01/29/2023 12:27:46 PM  Radiology DG Ribs Unilateral W/Chest Right  Result Date: 01/29/2023 CLINICAL DATA:  cough / rib pain EXAM: RIGHT RIBS AND CHEST - 3+ VIEW COMPARISON:  07/08/2022. FINDINGS: There is blunting of right lateral costophrenic angle with associated heterogeneous opacity overlying the right lower lung zone, laterally. Findings may represent atelectasis and/or consolidation with small pleural effusion. No pneumothorax seen. Bilateral lungs and left lateral costophrenic angle are otherwise clear. Normal cardio-mediastinal silhouette. There is mildly displaced fracture involving the anterolateral aspect of the right seventh rib. There is intervening callus formation, favoring subacute etiology. Correlate clinically. The soft tissues are within normal limits. IMPRESSION: 1. Mildly displaced fracture involving the  anterolateral aspect of the right seventh rib. There is intervening callus formation, favoring subacute etiology. 2. Small right pleural effusion with associated right lower lung zone atelectasis and/or consolidation. Electronically Signed   By: Jules Schick M.D.   On: 01/29/2023 10:35  normal sinus rhythm at a rate of 88  Procedures Procedures    Medications Ordered in ED Medications  cefTRIAXone (ROCEPHIN) 1 g in sodium chloride 0.9 % 100 mL IVPB (0 g Intravenous Stopped 01/29/23 1212)  azithromycin (ZITHROMAX) tablet 500 mg (500 mg Oral Given 01/29/23 1136)    ED Course/ Medical Decision Making/ A&P Clinical Course as of 01/29/23 1305  Thu Jan 29, 2023  1223 Comprehensive metabolic panel [  AH]  1224 CBC(!) [AH]  1224 Brain natriuretic peptide [AH]    Clinical Course User Index [AH] Arthor Captain, PA-C                                 Medical Decision Making This patient presents to the ED for concern of chest pain and cough, this involves an extensive number of treatment options, and is a complaint that carries with it a high risk of complications and morbidity.  Differential diagnosis for emergent cause of cough includes but is not limited to upper respiratory infection, lower respiratory infection, allergies, asthma, irritants, foreign body, medications such as ACE inhibitors, reflux, asthma, CHF, lung cancer, interstitial lung disease, psychiatric causes, postnasal drip and postinfectious bronchospasm.    Co morbidities:      hx of htn  Social Determinants of Health:       SDOH Screenings Tobacco Use: High Risk (01/29/2023)   Additional history:  {Additional history obtained from friend at bedside   Lab Tests:  I Ordered, and personally interpreted labs.  The pertinent results include:   CBC without elevated white blood cell count, CMP within normal limits.  BNP within normal limits.  Negative COVID test  Imaging Studies:  I ordered imaging studies including  right rib view and a chest x-ray I independently visualized and interpreted imaging which showed displaced anterolateral right seventh rib fracture and consolidation and effusion of the right lower lung.  This appears to be consistent with I agree with the radiologist interpretation  Cardiac Monitoring/ECG:       The patient was maintained on a cardiac monitor.  I personally viewed and interpreted the cardiac monitored which showed an underlying rhythm of: Normal sinus rhythm at a rate of 88  Medicines ordered and prescription drug management:  I ordered medication including Medications cefTRIAXone (ROCEPHIN) 1 g in sodium chloride 0.9 % 100 mL IVPB (0 g Intravenous Stopped 01/29/23 1212) azithromycin (ZITHROMAX) tablet 500 mg (500 mg Oral Given 01/29/23 1136) for right lower lobe pneumonia community-acquired Reevaluation of the patient after these medicines showed that the patient improved I have reviewed the patients home medicines and have made adjustments as needed  Test Considered:       CT of the chest however patient's symptoms are consistent with rib fracture and pneumonia  Critical Interventions:       Antibiotics  Consultations Obtained: Seen and shared visit with Dr. Marlene Bast  Problem List / ED Course:       (J18.9) Community acquired pneumonia of right lower lobe of lung  (primary encounter diagnosis)  (J90) Pleural effusion  (S22.31XA) Closed fracture of one rib of right side, initial encounter   MDM: Patient here with 2 weeks of cough.  History of smoking.  Doubt underlying malignancy or pulmonary embolus.  She has a right anterior rib fracture.  Patient given definitive fracture care with incentive spirometry.  She is intolerant of hydrocodone.  Have ordered tramadol.  Suggest over-the-counter lidocaine patches.  She will be discharged with azithromycin.  She was given Rocephin and azithromycin here.  I have instructed the patient on reasons to seek immediate  medical care.  Patient was offered admission but adamantly declines this at this time.  She appears otherwise appropriate for discharge without hypoxia.   Dispostion:  After consideration of the diagnostic results and the patients response to treatment, I feel that the patent would benefit from charge with  strict return precautions.    Amount and/or Complexity of Data Reviewed Labs: ordered. Decision-making details documented in ED Course. Radiology: ordered. Discussion of management or test interpretation with external provider(s): PDMP reviewed during this encounter.   Risk Prescription drug management.           Final Clinical Impression(s) / ED Diagnoses Final diagnoses:  Community acquired pneumonia of right lower lobe of lung  Pleural effusion  Closed fracture of one rib of right side, initial encounter    Rx / DC Orders ED Discharge Orders          Ordered    traMADol (ULTRAM) 50 MG tablet  Every 6 hours PRN        01/29/23 1226    azithromycin (ZITHROMAX) 250 MG tablet        01/29/23 1227    promethazine-dextromethorphan (PROMETHAZINE-DM) 6.25-15 MG/5ML syrup  4 times daily PRN        01/29/23 1240              Arthor Captain, PA-C 01/29/23 1307    Laurence Spates, MD 01/30/23 [phone]

## 2023-01-29 NOTE — ED Notes (Signed)
Cannot discharge, registration in chart.
